# Patient Record
Sex: Male | Born: 1955 | Race: White | Hispanic: No | Marital: Single | Smoking: Never smoker
Health system: Southern US, Community
[De-identification: ages and names within clinical notes are randomized; demographics above are authoritative.]

## PROBLEM LIST (undated history)

## (undated) DIAGNOSIS — J449 Chronic obstructive pulmonary disease, unspecified: Secondary | ICD-10-CM

## (undated) DIAGNOSIS — I10 Essential (primary) hypertension: Secondary | ICD-10-CM

## (undated) DIAGNOSIS — I2510 Atherosclerotic heart disease of native coronary artery without angina pectoris: Secondary | ICD-10-CM

## (undated) HISTORY — PX: TONSILLECTOMY: SUR1361

## (undated) HISTORY — PX: EYE SURGERY: SHX253

---

## 2022-01-26 ENCOUNTER — Encounter (HOSPITAL_BASED_OUTPATIENT_CLINIC_OR_DEPARTMENT_OTHER): Payer: Self-pay | Admitting: *Deleted

## 2022-01-26 ENCOUNTER — Other Ambulatory Visit: Payer: Self-pay

## 2022-01-26 ENCOUNTER — Emergency Department (HOSPITAL_BASED_OUTPATIENT_CLINIC_OR_DEPARTMENT_OTHER)
Admission: EM | Admit: 2022-01-26 | Discharge: 2022-01-26 | Disposition: A | Discharge status: Home / Self Care | Payer: 59 | Attending: Emergency Medicine | Admitting: Emergency Medicine

## 2022-01-26 ENCOUNTER — Emergency Department (HOSPITAL_BASED_OUTPATIENT_CLINIC_OR_DEPARTMENT_OTHER): Payer: 59

## 2022-01-26 DIAGNOSIS — Z79899 Other long term (current) drug therapy: Secondary | ICD-10-CM | POA: Insufficient documentation

## 2022-01-26 DIAGNOSIS — I10 Essential (primary) hypertension: Secondary | ICD-10-CM | POA: Diagnosis not present

## 2022-01-26 DIAGNOSIS — W11XXXA Fall on and from ladder, initial encounter: Secondary | ICD-10-CM | POA: Insufficient documentation

## 2022-01-26 DIAGNOSIS — S4992XA Unspecified injury of left shoulder and upper arm, initial encounter: Secondary | ICD-10-CM | POA: Diagnosis present

## 2022-01-26 HISTORY — DX: Essential (primary) hypertension: I10

## 2022-01-26 MED ORDER — HYDROCODONE-ACETAMINOPHEN 5-325 MG PO TABS: 1.0000 | ORAL_TABLET | Freq: Four times a day (QID) | ORAL | 0 refills | Status: DC | PRN

## 2022-01-26 MED ORDER — LIDOCAINE 5 % EX PTCH
1.0000 | MEDICATED_PATCH | CUTANEOUS | Status: DC
  Administered 2022-01-26: 1 via TRANSDERMAL
  Filled 2022-01-26: qty 1

## 2022-01-26 NOTE — Discharge Instructions (Signed)
Return to the ED with any new symptoms such as numbness or tingling in the hand ?Please follow-up with Dr. Clare Gandy.  You will need to call to make an appointment to be seen.  The numbers attached to this piece paper. ?Please remain in the sling at all times unless showering before being seen by Dr. Jordan Likes ?Please only take pain medication as a last resort.  Please be aware that you should not drive or operate heavy machinery under the influence of medication.  This medication might constipate you, and I advise you getting MiraLAX or Metamucil to avoid this. ?

## 2022-01-26 NOTE — ED Notes (Signed)
Has limited movement of LUE, left hand appears swollen, left radial pulse 2+ and easily palpable, denies any tingling or numbness in LUE as well. Noted to have strong grip of left hand as well. Moves RUE without issues, has strong plantar and dorsal flexion and denies any lower back or hip pain. Neuro status WNL ?

## 2022-01-26 NOTE — ED Provider Notes (Signed)
?[facility] EMERGENCY DEPARTMENT ?Provider Note ? ? ?CSN: [id number] ?Arrival date & time: 01/26/22  [id number] ? ?  ? ?History ? ?Chief Complaint  ?Patient presents with  ? Fall  ? ? ?Chuck HintRicky E Asher is a 66 y.o. male with medical history of hypertension currently on lisinopril.  Patient presents ED for evaluation of fall.  Patient states that yesterday he fell off a ladder that was 5 feet in the air.  Patient reports that the ladder became unstable causing him to fall to the left.  Patient reports that he fell onto his left side and presents today with left shoulder pain, left elbow pain and left wrist/hand pain.  Patient denies blood thinners, patient denies any his head or losing consciousness.  Patient denies any neck pain or back pain. ? ? ?Fall ? ? ?  ? ?Home Medications ?Prior to Admission medications   ?Medication Sig Start Date End Date Taking? Authorizing Provider  ?HYDROcodone-acetaminophen (NORCO/VICODIN) 5-325 MG tablet Take 1 tablet by mouth every 6 (six) hours as needed for severe pain. 01/26/22  Yes Al DecantGroce, Jonique Kulig F, PA-C  ?lisinopril (ZESTRIL) 20 MG tablet Take 20 mg by mouth daily.   Yes [provider]  ?   ? ?Allergies    ?Patient has no known allergies.   ? ?Review of Systems   ?Review of Systems  ?Musculoskeletal:  Positive for arthralgias and myalgias. Negative for back pain and neck pain.  ?All other systems reviewed and are negative. ? ?Physical Exam ?Updated Vital Signs ?BP (!) 166/80   Pulse 78   Temp 98.1 ?F (36.7 ?C)   Resp 20   Ht 5\' 7"  (1.702 m)   Wt 79.4 kg   SpO2 98%   BMI 27.41 kg/m?  ?Physical Exam ?Vitals and nursing note reviewed.  ?Constitutional:   ?   General: He is not in acute distress. ?   Appearance: Normal appearance. He is not ill-appearing, toxic-appearing or diaphoretic.  ?HENT:  ?   Head: Normocephalic and atraumatic.  ?   Nose: Nose normal. No congestion.  ?   Mouth/Throat:  ?   Mouth: Mucous membranes are moist.  ?   Pharynx: Oropharynx is clear.   ?Eyes:  ?   Extraocular Movements: Extraocular movements intact.  ?   Conjunctiva/sclera: Conjunctivae normal.  ?   Pupils: Pupils are equal, round, and reactive to light.  ?Cardiovascular:  ?   Rate and Rhythm: Normal rate and regular rhythm.  ?Pulmonary:  ?   Effort: Pulmonary effort is normal.  ?   Breath sounds: Normal breath sounds. No wheezing.  ?Abdominal:  ?   General: Abdomen is flat. Bowel sounds are normal.  ?   Palpations: Abdomen is soft.  ?   Tenderness: There is no abdominal tenderness.  ?Musculoskeletal:  ?   Right shoulder: Normal.  ?   Left shoulder: Swelling and tenderness present. No deformity, effusion or laceration. Decreased range of motion. Decreased strength. Normal pulse.  ?   Cervical back: Normal range of motion and neck supple. No tenderness.  ?   Comments: Patient has decreased strength to left upper extremity.  Patient also has decreased ability to range his left shoulder.  The patient is unable to actively range his shoulder and can only tolerate slight ranging of his left shoulder passively.  2+ radial pulse noted.  ?Skin: ?   General: Skin is warm and dry.  ?   Capillary Refill: Capillary refill takes less than 2 seconds.  ?Neurological:  ?  General: No focal deficit present.  ?   Mental Status: He is alert and oriented to person, place, and time.  ?   GCS: GCS eye subscore is 4. GCS verbal subscore is 5. GCS motor subscore is 6.  ?   Cranial Nerves: Cranial nerves 2-12 are intact.  ?   Sensory: Sensation is intact. No sensory deficit.  ?   Motor: Motor function is intact. No weakness.  ?   Coordination: Coordination is intact. Heel to [facility] Test normal.  ? ? ?ED Results / Procedures / Treatments   ?Labs ?(all labs ordered are listed, but only abnormal results are displayed) ?Labs Reviewed - No data to display ? ?EKG ?None ? ?Radiology ?DG Forearm Left ? ?Result Date: 01/26/2022 ?CLINICAL DATA:  Pain after fall from ladder yesterday. EXAM: LEFT FOREARM - 2 VIEW COMPARISON:  None  Available. FINDINGS: There is no evidence of acute fracture or other focal bone lesions. Well corticated osseous fragment at the ulnar styloid process reflect sequela prior ulnar styloid process fracture. Soft tissues are unremarkable. IMPRESSION: No acute osseous abnormality identified. Electronically Signed   By: Maudry Mayhew M.D.   On: 01/26/2022 10:20  ? ?DG Humerus Left ? ?Result Date: 01/26/2022 ?CLINICAL DATA:  Fall on shoulder from 5 foot ladder yesterday. EXAM: LEFT HUMERUS - 2+ VIEW COMPARISON:  None Available. FINDINGS: There is no evidence of fracture or other focal bone lesions. Soft tissues are unremarkable. Visualized lung fields are clear. No displaced rib fracture identified within the acquired field-of-view. IMPRESSION: No acute osseous abnormality identified. Electronically Signed   By: Maudry Mayhew M.D.   On: 01/26/2022 10:19  ? ?DG Hand Complete Left ? ?Result Date: 01/26/2022 ?CLINICAL DATA:  Pain after fall from ladder yesterday. EXAM: LEFT HAND - COMPLETE 3+ VIEW COMPARISON:  None Available. FINDINGS: There is no evidence of acute fracture or dislocation. Well corticated osseous fragment at the ulnar styloid process reflect sequela prior trauma. Mild multifocal degenerative change of the hand. Soft tissues are unremarkable. IMPRESSION: 1. No acute fracture or dislocation of the left hand identified. 2. Mild multifocal degenerative change. Electronically Signed   By: Maudry Mayhew M.D.   On: 01/26/2022 10:22   ? ?Procedures ?Procedures  ? ?Medications Ordered in ED ?Medications  ?lidocaine (LIDODERM) 5 % 1 patch (1 patch Transdermal Patch Applied 01/26/22 1127)  ? ? ?ED Course/ Medical Decision Making/ A&P ?  ?                        ?Medical Decision Making ?Amount and/or Complexity of Data Reviewed ?Radiology: ordered. ? ? ?66 year old male presents to ED for evaluation.  Please see HPI for further details. ? ?On examination, the patient is alert and orient x4.  Patient does not have any  focal neurodeficits on neurological examination.  Patient is afebrile, nontachycardic.  Patient lung sounds are clear bilaterally, the patient is nonhypoxic on room air.  Patient abdomen soft and compressible all 4 quadrants.  Patient complains of left-sided wrist pain however the patient has full range of motion of his wrist with slight soft tissue swelling.  Patient planes of elbow pain however patient has full range of motion to the left elbow with no soft tissue swelling.  Patient also complaining of left shoulder pain, the patient is unable to range his left shoulder actively.  With passive range of motion exercises, the patient complains of increased pain. ? ?Patient worked up utilizing following labs and imaging  studies interpreted by me personally: ?- Plain film imaging of left hand, left forearm, left humerus do not show any sign of fracture, dislocation, subluxation. ? ?At this time, patient symptoms may be a result of rotator cuff tear.  Patient will be referred to sports medicine doctor, placed in sling, provided with lidocaine patch as well as pain management pills.  Patient given return precautions and he voiced understanding with these.  Patient had all his questions answered to satisfaction.  The patient is stable at this time for discharge home. ? ? ?Final Clinical Impression(s) / ED Diagnoses ?Final diagnoses:  ?Injury of left shoulder, initial encounter  ? ? ?Rx / DC Orders ?ED Discharge Orders   ? ?      Ordered  ?  HYDROcodone-acetaminophen (NORCO/VICODIN) 5-325 MG tablet  Every 6 hours PRN       ? 01/26/22 1143  ? ?  ?  ? ?  ? ? ?  ?Al Decant, PA-C ?01/26/22 1204 ? ?  ?Derwood Kaplan, MD ?01/26/22 1230 ? ?

## 2022-01-26 NOTE — ED Triage Notes (Signed)
States he fell off a 109ft ladder yesterday onto the ground, no LOC, presents with left shoulder/ scapula pain, left elbow pain and left wrist/ hand pain ?

## 2022-01-27 ENCOUNTER — Ambulatory Visit: Payer: 59 | Admitting: Family Medicine

## 2022-03-11 ENCOUNTER — Ambulatory Visit: Payer: 59 | Admitting: Orthopaedic Surgery

## 2022-03-19 ENCOUNTER — Ambulatory Visit: Payer: 59 | Admitting: Orthopaedic Surgery

## 2022-03-19 ENCOUNTER — Encounter: Payer: Self-pay | Admitting: Orthopaedic Surgery

## 2022-03-19 ENCOUNTER — Telehealth: Payer: Self-pay | Admitting: Orthopaedic Surgery

## 2022-03-19 ENCOUNTER — Ambulatory Visit (INDEPENDENT_AMBULATORY_CARE_PROVIDER_SITE_OTHER): Payer: 59

## 2022-03-19 DIAGNOSIS — M25512 Pain in left shoulder: Secondary | ICD-10-CM | POA: Insufficient documentation

## 2022-03-19 NOTE — Progress Notes (Signed)
Office Visit Note   Patient: Trevor Daniels           Date of Birth: 1955/10/16           MRN: [id number] Visit Date: 03/19/2022              Requested by: Darryll Capers, PA-C [address] [place],  [place] [address] PCP: Darryll Capers, PA-C   Assessment & Plan: Visit Diagnoses:  1. 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  Will need MRI to fully evaluate the extent of the injury.  May need to consider physical therapy to address the frozen shoulder prior to surgical intervention if that is indicated.  Follow-up after the MRI.  Work note provided today.  Follow-Up Instructions: No follow-ups on file.   Orders:  Orders Placed This Encounter  Procedures   XR Shoulder Left   No orders of the defined types were placed in this encounter.     Procedures: No procedures performed   Clinical Data: No additional findings.   Subjective: Chief Complaint  Patient presents with   Left Shoulder - Pain    HPI Rashon is a 66 year old gentleman follow-up from the ED status post mechanical fall from the ladder on 01/25/2022.  He fell on his elbow and jammed his shoulder.  He has had inability to move his arm above his head since then.  Currently still works.  Review of Systems  Constitutional: Negative.   All other systems reviewed and are negative.    Objective: Vital Signs: There were no vitals taken for this visit.  Physical Exam Vitals and nursing note reviewed.  Constitutional:      Appearance: He is well-developed.  HENT:     Head: Normocephalic and atraumatic.  Eyes:     Pupils: Pupils are equal, round, and reactive to light.  Pulmonary:     Effort: Pulmonary effort is normal.  Abdominal:     Palpations: Abdomen is soft.  Musculoskeletal:        General: Normal range of motion.     Cervical back: Neck supple.  Skin:    General: Skin is warm.  Neurological:     Mental  Status: He is alert and oriented to person, place, and time.  Psychiatric:        Behavior: Behavior normal.        Thought Content: Thought content normal.        Judgment: Judgment normal.     Ortho Exam Examination left shoulder shows moderate decrease and passive flexion external rotation abduction.  Active motion of the rotator cuff consistent with pseudoparalysis.  No real bony tenderness.  No neck symptoms. Specialty Comments:  No specialty comments available.  Imaging: XR Shoulder Left  Result Date: 03/19/2022 Type II acromion.  No acute abnormalities.    PMFS History: Patient Active Problem List   Diagnosis Date Noted   Acute pain of left shoulder 03/19/2022   Past Medical History:  Diagnosis Date   Hypertension     History reviewed. No pertinent family history.  History reviewed. No pertinent surgical history. Social History   Occupational History   Not on file  Tobacco Use   Smoking status: Never   Smokeless tobacco: Not on file  Substance and Sexual Activity   Alcohol use: Never   Drug use: Never   Sexual activity: Not on file

## 2022-03-19 NOTE — Telephone Encounter (Signed)
Patient called in stating he is out of network for [facility] for his MRI but he called [facility] in [place] said they would accept his insurance but we need to authorize it first.

## 2022-03-19 NOTE — Addendum Note (Signed)
Addended by: Wendi Maya on: 03/19/2022 11:00 AM   Modules accepted: Orders

## 2022-03-20 NOTE — Telephone Encounter (Signed)
Referral faxed to [employer] for authorization-once approved will send to [facility]

## 2022-03-24 ENCOUNTER — Ambulatory Visit (INDEPENDENT_AMBULATORY_CARE_PROVIDER_SITE_OTHER): Payer: 59

## 2022-03-24 DIAGNOSIS — M25512 Pain in left shoulder: Secondary | ICD-10-CM

## 2022-03-28 ENCOUNTER — Other Ambulatory Visit: Payer: 59

## 2022-03-31 ENCOUNTER — Ambulatory Visit: Payer: 59 | Admitting: Orthopaedic Surgery

## 2022-03-31 ENCOUNTER — Encounter: Payer: Self-pay | Admitting: Orthopaedic Surgery

## 2022-03-31 DIAGNOSIS — M25512 Pain in left shoulder: Secondary | ICD-10-CM

## 2022-03-31 NOTE — Progress Notes (Signed)
   Office Visit Note   Patient: Trevor Daniels           Date of Birth: 1956-04-07           MRN: [id number] Visit Date: 03/31/2022              Requested by: Darryll Capers, PA-C [address] [place],  [place] [address] PCP: Darryll Capers, PA-C   Assessment & Plan: Visit Diagnoses:  1. Acute pain of left shoulder     Plan: Impression is large rotator cuff tears with mild atrophy.  Not sure if this is repairable or needs reverse shoulder.  Will refer to Dr. Steward Drone for surgical treatment.    Follow-Up Instructions: No follow-ups on file.   Orders:  No orders of the defined types were placed in this encounter.  No orders of the defined types were placed in this encounter.     Procedures: No procedures performed   Clinical Data: No additional findings.   Subjective: Chief Complaint  Patient presents with   Left Shoulder - Follow-up    MRI review    HPI Trevor Daniels is here to review left shoulder MRI. Review of Systems   Objective: Vital Signs: There were no vitals taken for this visit.  Physical Exam  Ortho Exam Examination of left shoulder shows significant pain with passive range of motion.  Pain and significant weakness with testing of subscap infraspinatus and supraspinatus with drop arm sign. Specialty Comments:  No specialty comments available.  Imaging: No results found.   PMFS History: Patient Active Problem List   Diagnosis Date Noted   Acute pain of left shoulder 03/19/2022   Past Medical History:  Diagnosis Date   Hypertension     No family history on file.  History reviewed. No pertinent surgical history. Social History   Occupational History   Not on file  Tobacco Use   Smoking status: Never   Smokeless tobacco: Not on file  Substance and Sexual Activity   Alcohol use: Never   Drug use: Never   Sexual activity: Not on file

## 2022-04-15 ENCOUNTER — Ambulatory Visit (INDEPENDENT_AMBULATORY_CARE_PROVIDER_SITE_OTHER): Payer: BC Managed Care – PPO | Admitting: Orthopaedic Surgery

## 2022-04-15 ENCOUNTER — Other Ambulatory Visit (HOSPITAL_BASED_OUTPATIENT_CLINIC_OR_DEPARTMENT_OTHER): Payer: Self-pay

## 2022-04-15 ENCOUNTER — Ambulatory Visit (HOSPITAL_BASED_OUTPATIENT_CLINIC_OR_DEPARTMENT_OTHER): Payer: Self-pay | Admitting: Orthopaedic Surgery

## 2022-04-15 DIAGNOSIS — M12812 Other specific arthropathies, not elsewhere classified, left shoulder: Secondary | ICD-10-CM | POA: Diagnosis not present

## 2022-04-15 MED ORDER — OXYCODONE HCL 5 MG PO TABS
5.0000 mg | ORAL_TABLET | ORAL | 0 refills | Status: AC | PRN
  Filled 2022-04-15: qty 20, 4d supply, fill #0

## 2022-04-15 MED ORDER — ASPIRIN 325 MG PO TBEC
325.0000 mg | DELAYED_RELEASE_TABLET | Freq: Every day | ORAL | 0 refills | Status: AC
  Filled 2022-04-15: qty 30, 30d supply, fill #0

## 2022-04-15 MED ORDER — ACETAMINOPHEN 500 MG PO TABS
500.0000 mg | ORAL_TABLET | Freq: Three times a day (TID) | ORAL | 0 refills | Status: AC
  Filled 2022-04-15: qty 30, 10d supply, fill #0

## 2022-04-15 MED ORDER — IBUPROFEN 800 MG PO TABS
800.0000 mg | ORAL_TABLET | Freq: Three times a day (TID) | ORAL | 0 refills | Status: AC
  Filled 2022-04-15: qty 30, 10d supply, fill #0

## 2022-04-15 NOTE — Progress Notes (Signed)
Chief Complaint: Left shoulder pseudoparalysis     History of Present Illness:    Trevor Daniels is a 66 y.o. male right-hand-dominant male presents with left shoulder pain and limited motion since Jan 26, 2022.  He states that at that time he had a fall directly on the shoulder when he landed off of a ladder onto the elbow.  Since that time he has had essentially no range of motion or function of the left shoulder.  He is here today as a referral from Dr.Xu as he was found to have a large rotator cuff tear.  He states that he does have a history of right shoulder rotator cuff tear which is treated with conservative management is overall doing quite well.  He is here today as he states the left shoulder feels different.  He is essentially not able to use the left arm whatsoever.  He is taking Tylenol.  He is having a very difficult time laying on the sleep.  He still does work part-time in [employer] and as a result has to do heavy lifting which she has not been able to do as a result of the shoulder.  He does not smoke.  He is on 81 mg aspirin.    Surgical History:   None  PMH/PSH/Family History/Social History/Meds/Allergies:    Past Medical History:  Diagnosis Date   Hypertension    No past surgical history on file. Social History   Socioeconomic History   Marital status: Single    Spouse name: Not on file   Number of children: Not on file   Years of education: Not on file   Highest education level: Not on file  Occupational History   Not on file  Tobacco Use   Smoking status: Never   Smokeless tobacco: Not on file  Substance and Sexual Activity   Alcohol use: Never   Drug use: Never   Sexual activity: Not on file  Other Topics Concern   Not on file  Social History Narrative   Not on file   Social Determinants of Health   Financial Resource Strain: Not on file  Food Insecurity: Not on file  Transportation Needs: Not on file   Physical Activity: Not on file  Stress: Not on file  Social Connections: Not on file   No family history on file. No Known Allergies Current Outpatient Medications  Medication Sig Dispense Refill   acetaminophen (TYLENOL) 500 MG tablet Take 1 tablet (500 mg total) by mouth every 8 (eight) hours for 10 days. 30 tablet 0   aspirin EC 325 MG tablet Take 1 tablet (325 mg total) by mouth daily. 30 tablet 0   ibuprofen (ADVIL) 800 MG tablet Take 1 tablet (800 mg total) by mouth every 8 (eight) hours for 10 days. Please take with food, please alternate with acetaminophen 30 tablet 0   oxyCODONE (OXY IR/ROXICODONE) 5 MG immediate release tablet Take 1 tablet (5 mg total) by mouth every 4 (four) hours as needed (severe pain). 20 tablet 0   lisinopril (ZESTRIL) 20 MG tablet Take 20 mg by mouth daily.     No current facility-administered medications for this visit.   No results found.  Review of Systems:   A ROS was performed including pertinent positives and negatives as documented in the  HPI.  Physical Exam :   Constitutional: NAD and appears stated age Neurological: Alert and oriented Psych: Appropriate affect and cooperative There were no vitals taken for this visit.   Comprehensive Musculoskeletal Exam:    Musculoskeletal Exam    Inspection Right Left  Skin No atrophy or winging No atrophy or winging  Palpation    Tenderness None Lateral deltoid  Range of Motion    Flexion (passive) 170 100  Flexion (active) 170 10  Abduction 170 10  ER at the side 70 0  Can reach behind back to T12 Back pocket  Strength     Full 3/5, negative belly press  Special Tests    Pseudoparalytic No No  Neurologic    Fires PIN, radial, median, ulnar, musculocutaneous, axillary, suprascapular, long thoracic, and spinal accessory innervated muscles. No abnormal sensibility  Vascular/Lymphatic    Radial Pulse 2+ 2+  Cervical Exam    Patient has symmetric cervical range of motion with negative  Spurling's test.  Special Test:      Imaging:   Xray (3 views left shoulder): Mild AC joint arthritis  MRI (left shoulder): Full-thickness massive rotator cuff tear involving the supra and infraspinatus with retraction beyond the level of the glenoid and significant atrophy on T1 imaging involving both the supraspinatus and infraspinatus  I personally reviewed and interpreted the radiographs.   Assessment:   66 y.o. male right-hand-dominant male presents with left shoulder pseudoparalysis following a massive rotator cuff injury on May 15 where he fell off of a ladder.  At today's visit I did discuss treatment options with him.  Given the fact that he does have completely nonfunctional left shoulder I do believe that surgical treatment is necessary in order to optimize his ability to use the arm.  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  He is not able to lift that arm at all away from the side.  I discussed with him treatment options including rotator cuff repair versus superior capsular reconstruction versus reverse shoulder arthroplasty.  Given the fact that his tear is very large and retracted beyond the glenoid with significant fatty atrophy I do believe that he would be less likely to benefit from a rotator cuff repair.  This impacts given the fact that he does have essentially pseudoparalysis I do believe that a superior capsule reconstruction would be less helpful for him.  At this time I believe that a reverse shoulder arthroplasty would give him the quickest and best likelihood of recovery.  I did discuss with him the specific risks associated with this including hardware failure or infection.  He would like to proceed with this.  Plan :    -Plan for left shoulder reverse shoulder arthroplasty   After a lengthy discussion of treatment options, including risks, benefits, alternatives, complications of  surgical and nonsurgical conservative options, the patient elected surgical repair.   The patient  is aware of the material risks  and complications including, but not limited to injury to adjacent structures, neurovascular injury, infection, numbness, bleeding, implant failure, thermal burns, stiffness, persistent pain, failure to heal, disease transmission from allograft, need for further surgery, dislocation, anesthetic risks, blood clots, risks of death,and others. The probabilities of surgical success and failure discussed with patient given their particular co-morbidities.The time and nature of expected rehabilitation and recovery was discussed.The patient's questions were all answered preoperatively.  No barriers to understanding were noted.  I explained the natural history of the disease process and Rx rationale.  I explained to the patient what I considered to be reasonable expectations given their personal situation.  The final treatment plan was arrived at through a shared patient decision making process model.      I personally saw and evaluated the patient, and participated in the management and treatment plan.  Huel Cote, MD Attending Physician, Orthopedic Surgery  This document was dictated using Dragon voice recognition software. A reasonable attempt at proof reading has been made to minimize errors.

## 2022-04-19 ENCOUNTER — Ambulatory Visit (HOSPITAL_BASED_OUTPATIENT_CLINIC_OR_DEPARTMENT_OTHER)
Admission: RE | Admit: 2022-04-19 | Discharge: 2022-04-19 | Disposition: A | Discharge status: Home / Self Care | Payer: BC Managed Care – PPO | Source: Ambulatory Visit | Attending: Orthopaedic Surgery | Admitting: Orthopaedic Surgery

## 2022-04-19 DIAGNOSIS — M12812 Other specific arthropathies, not elsewhere classified, left shoulder: Secondary | ICD-10-CM | POA: Diagnosis present

## 2022-04-22 ENCOUNTER — Ambulatory Visit (HOSPITAL_BASED_OUTPATIENT_CLINIC_OR_DEPARTMENT_OTHER): Payer: BC Managed Care – PPO

## 2022-05-06 ENCOUNTER — Ambulatory Visit (HOSPITAL_BASED_OUTPATIENT_CLINIC_OR_DEPARTMENT_OTHER): Payer: BC Managed Care – PPO | Admitting: Orthopaedic Surgery

## 2022-05-06 ENCOUNTER — Encounter (HOSPITAL_BASED_OUTPATIENT_CLINIC_OR_DEPARTMENT_OTHER): Payer: Self-pay | Admitting: Orthopaedic Surgery

## 2022-05-06 ENCOUNTER — Encounter (HOSPITAL_BASED_OUTPATIENT_CLINIC_OR_DEPARTMENT_OTHER): Payer: Self-pay

## 2022-06-30 NOTE — Progress Notes (Addendum)
Surgical Instructions    Your procedure is scheduled on Tuesday October 24th.  Report to [facility] Main Entrance "A" at 1130 A.M., then check in with the Admitting office.  Call this number if you have problems the morning of surgery:  [phone]   If you have any questions prior to your surgery date call [phone]: Open Monday-Friday 8am-4pm If you experience any cold or flu symptoms such as cough, fever, chills, shortness of breath, etc. between now and your scheduled surgery, please notify us at the above number     Remember:  Do not eat after midnight the night before your surgery  You may drink clear liquids until 10:30 the morning of your surgery.   Clear liquids allowed are: Water, Non-Citrus Juices (without pulp), Carbonated Beverages, Clear Tea, Black Coffee ONLY (NO MILK, CREAM OR POWDERED CREAMER of any kind), and Gatorade   Enhanced Recovery after Surgery for Orthopedics Enhanced Recovery after Surgery is a protocol used to improve the stress on your body and your recovery after surgery.  Patient Instructions  The day of surgery (if you do NOT have diabetes):  Drink ONE (1) Pre-Surgery Clear Ensure by __1030__ am the morning of surgery   This drink was given to you during your hospital  pre-op appointment visit. Nothing else to drink after completing the  Pre-Surgery Clear Ensure.          If you have questions, please contact your surgeon's office.   Take these medicines the morning of surgery with A SIP OF WATER: BREZTRI AEROSPHERE 160-9-4.8 MCG/ACT AERO  IF NEEDED albuterol (VENTOLIN HFA) 108 (90 Base) MCG/ACT inhaler oxyCODONE (OXY IR/ROXICODONE) 5 MG immediate release tablet  Please bring all inhalers with you to the hospital.   Follow your surgeon's instructions on when to stop Aspirin.  If no instructions were given by your surgeon then you will need to call the office to get those instructions.     As of today, STOP taking any Aspirin (unless  otherwise instructed by your surgeon) Aleve, Naproxen, Ibuprofen, Motrin, Advil, Goody's, BC's, all herbal medications, fish oil, and all vitamins.           Do not wear jewelry  Do not wear lotions, powders, cologne or deodorant. Do not shave 48 hours prior to surgery.  Men may shave face and neck. Do not bring valuables to the hospital. Do not wear nail polish  [facility] is not responsible for any belongings or valuables.    Do NOT Smoke (Tobacco/Vaping)  24 hours prior to your procedure  If you use a CPAP at night, you may bring your mask for your overnight stay.   Contacts, glasses, hearing aids, dentures or partials may not be worn into surgery, please bring cases for these belongings   For patients admitted to the hospital, discharge time will be determined by your treatment team.   Patients discharged the day of surgery will not be allowed to drive home, and someone needs to stay with them for 24 hours.   SURGICAL WAITING ROOM VISITATION Patients having surgery or a procedure may have no more than 2 support people in the waiting area - these visitors may rotate.   Children under the age of 34 must have an adult with them who is not the patient. If the patient needs to stay at the hospital during part of their recovery, the visitor guidelines for inpatient rooms apply. Pre-op nurse will coordinate an appropriate time for 1 support person to accompany  patient in pre-op.  This support person may not rotate.   Please refer to [email or web address] for the visitor guidelines for Inpatients (after your surgery is over and you are in a regular room).    Special instructions:     [place]- Preparing for Shoulder Surgery  ?  Before surgery, you can play an important role. Because skin is not sterile, your skin needs to be as free of germs as possible. You can reduce the number of germs on your skin by using the following  products.   1). Benzoyl Peroxide Gel: reduces the number of germs present on the skin   *Applied twice a day to shoulder area starting two days before surgery     2). Chlorhexidine Gluconate (CHG) Soap: An antiseptic cleaner that kills germs and bonds with the skin to continue killing germs even after washing   *Used for showering the night before surgery and morning of surgery   ?    Please follow these instructions carefully:     1). BENZOYL PEROXIDE 5% GEL (Please do not use if you have an allergy to benzoyl peroxide.   If your skin becomes reddened/irritated stop using the benzoyl peroxide)     Starting TWO DAYS BEFORE surgery:    Apply benzoyl peroxide in the morning and at night. Apply after taking a shower. If you are not taking a shower clean entire shoulder front, back, and side along with the armpit with a clean wet washcloth.     Place a quarter-sized amount of gel on your shoulder and rub in thoroughly, making sure to cover the front, back, and side of your shoulder, along with the armpit.                           Do this twice a day for two days.  (Last application is the night before surgery, AFTER using the CHG soap as described below).   Do NOT apply benzoyl peroxide gel on the day of surgery.   2 days before ____ AM   ____ PM              1 day before ____ AM   ____ PM    2) CHG Soap: Please do not use if you have an allergy to CHG or antibacterial soaps. If your skin becomes reddened/irritated stop using the CHG.  Do not shave (including legs and underarms) for at least 48 hours prior to first CHG shower. It is OK to shave your face.  Please follow these instructions carefully.   Shower the NIGHT BEFORE SURGERY (before applying benzoyl peroxide gel) and the MORNING OF SURGERY with CHG Soap.   If you chose to wash your hair, wash your hair first as usual with your normal shampoo.  After you shampoo, rinse your hair and body thoroughly to remove the  shampoo.  Use CHG as you would any other liquid soap. You can apply CHG directly to the skin and wash gently with a scrungie or a clean washcloth.   Apply the CHG Soap to your body ONLY FROM THE NECK DOWN.  Do not use on open wounds or open sores. Avoid contact with your eyes, ears, mouth and genitals (private parts). Wash Face and genitals (private parts) with your normal soap.   Wash thoroughly, paying special attention to the area where your surgery will be performed.  Thoroughly rinse your body with warm water from the neck down.  DO  NOT shower/wash with your normal soap after using and rinsing off the CHG Soap.  Pat yourself dry with a CLEAN TOWEL.  Wear CLEAN PAJAMAS to bed the night before surgery  Place CLEAN SHEETS on your bed the night of your first shower and DO NOT SLEEP WITH PETS.  Oral Hygiene is also important to reduce your risk of infection.  Remember - BRUSH YOUR TEETH THE MORNING OF SURGERY WITH YOUR REGULAR TOOTHPASTE    Day of Surgery:  Take a shower with CHG soap. Wear Clean/Comfortable clothing the morning of surgery Do not apply any deodorants/lotions.   Remember to brush your teeth WITH YOUR REGULAR TOOTHPASTE.    If you received a COVID test during your pre-op visit, it is requested that you wear a mask when out in public, stay away from anyone that may not be feeling well, and notify your surgeon if you develop symptoms. If you have been in contact with anyone that has tested positive in the last 10 days, please notify your surgeon.    Please read over the following fact sheets that you were given.

## 2022-07-01 ENCOUNTER — Encounter (HOSPITAL_COMMUNITY): Payer: Self-pay

## 2022-07-01 ENCOUNTER — Encounter (HOSPITAL_COMMUNITY)
Admission: RE | Admit: 2022-07-01 | Discharge: 2022-07-01 | Disposition: A | Discharge status: Home / Self Care | Payer: Medicare HMO | Source: Ambulatory Visit | Attending: Orthopaedic Surgery | Admitting: Orthopaedic Surgery

## 2022-07-01 ENCOUNTER — Other Ambulatory Visit: Payer: Self-pay

## 2022-07-01 VITALS — BP 173/80 | HR 78 | Temp 97.5°F | Resp 17 | Ht 68.0 in | Wt 191.3 lb

## 2022-07-01 DIAGNOSIS — M25512 Pain in left shoulder: Secondary | ICD-10-CM

## 2022-07-01 DIAGNOSIS — I10 Essential (primary) hypertension: Secondary | ICD-10-CM | POA: Insufficient documentation

## 2022-07-01 DIAGNOSIS — Z01818 Encounter for other preprocedural examination: Secondary | ICD-10-CM | POA: Insufficient documentation

## 2022-07-01 HISTORY — DX: Chronic obstructive pulmonary disease, unspecified: J44.9

## 2022-07-01 HISTORY — DX: Atherosclerotic heart disease of native coronary artery without angina pectoris: I25.10

## 2022-07-01 LAB — BASIC METABOLIC PANEL
Anion gap: 5 (ref 5–15)
BUN: 22 mg/dL (ref 8–23)
CO2: 28 mmol/L (ref 22–32)
Calcium: 9.2 mg/dL (ref 8.9–10.3)
Chloride: 105 mmol/L (ref 98–111)
Creatinine, Ser: 0.9 mg/dL (ref 0.61–1.24)
GFR, Estimated: >60 mL/min (ref >60)
Glucose, Bld: 113 mg/dL — ABNORMAL HIGH (ref 70–99)
Potassium: 4.6 mmol/L (ref 3.5–5.1)
Sodium: 138 mmol/L (ref 135–145)

## 2022-07-01 LAB — SURGICAL PCR SCREEN
MRSA, PCR: NEGATIVE
Staphylococcus aureus: POSITIVE — AB

## 2022-07-01 LAB — TYPE AND SCREEN
ABO/RH(D): A POS
Antibody Screen: NEGATIVE

## 2022-07-01 LAB — CBC
HCT: 38.8 % — ABNORMAL LOW (ref 39.0–52.0)
Hemoglobin: 13.1 g/dL (ref 13.0–17.0)
MCH: 33.2 pg (ref 26.0–34.0)
MCHC: 33.8 g/dL (ref 30.0–36.0)
MCV: 98.5 fL (ref 80.0–100.0)
Platelets: 239 10*3/uL (ref 150–400)
RBC: 3.94 MIL/uL — ABNORMAL LOW (ref 4.22–5.81)
RDW: 13.6 % (ref 11.5–15.5)
WBC: 5.7 10*3/uL (ref 4.0–10.5)
nRBC: 0 % (ref 0.0–0.2)

## 2022-07-01 NOTE — Progress Notes (Addendum)
PCP - Candida Peeling, PA-C Cardiologist - Dr.John Cahill-Bethany   PPM/ICD - denies Device Orders - n/a Rep Notified - n/a  Chest x-ray - n/a EKG - 07/01/22 Stress Test - denies ECHO - denies Cardiac Cath - denies  Sleep Study - denies CPAP - n/a  DM-denies Blood Thinner Instructions:denies Aspirin Instructions:-pt states the form he received state to stop Aspirin 2 days before his surgery   ERAS Protcol - PRE-SURGERY Ensure or G2-   COVID TEST- n/a   Anesthesia review: YES. Cardiac HX. CAD. Pt stated he has cardiac clearance from [facility]. Records requested from [facility].   Patient denies shortness of breath, fever, cough and chest pain at PAT appointment   All instructions explained to the patient, with a verbal understanding of the material. Patient agrees to go over the instructions while at home for a better understanding. The opportunity to ask questions was provided.

## 2022-07-02 ENCOUNTER — Encounter (HOSPITAL_COMMUNITY): Payer: Self-pay

## 2022-07-02 NOTE — Progress Notes (Signed)
Anesthesia Chart Review:  Patient's chronic conditions followed by PCP Candida Peeling, PA-C at [facility].  She recently referred patient to cardiologist at [facility] Dr. Tonia Ghent for evaluation of coronary calcifications on CT and preop evaluation.  Dr. Edson Snowball felt no additional evaluation was needed and cleared the patient for surgery.  Clearance dated 06/21/2022 states, "no preoperative medication adjustments requested.  May hold aspirin for up to 4 weeks."  Copy on chart.  History of COPD, maintained on Breztri and as needed albuterol.  Preop labs reviewed, unremarkable.  EKG 07/01/2022: NSR.  Rate 68.   Wynonia Musty [facility]/Anesthesiology Phone [phone] 07/02/2022 11:14 AM

## 2022-07-02 NOTE — Anesthesia Preprocedure Evaluation (Addendum)
Anesthesia Evaluation  Patient identified by MRN, date of birth, ID band Patient awake    Reviewed: Allergy & Precautions, NPO status , Patient's Chart, lab work & pertinent test results  Airway Mallampati: II  TM Distance: >3 FB Neck ROM: Full    Dental  (+) Edentulous Upper   Pulmonary COPD,    Pulmonary exam normal breath sounds clear to auscultation       Cardiovascular hypertension, Pt. on medications + CAD  Normal cardiovascular exam Rhythm:Regular Rate:Normal     Neuro/Psych negative neurological ROS  negative psych ROS   GI/Hepatic negative GI ROS, Neg liver ROS,   Endo/Other  negative endocrine ROS  Renal/GU negative Renal ROS  negative genitourinary   Musculoskeletal negative musculoskeletal ROS (+)   Abdominal   Peds negative pediatric ROS (+)  Hematology negative hematology ROS (+)   Anesthesia Other Findings   Reproductive/Obstetrics negative OB ROS                           Anesthesia Physical Anesthesia Plan  ASA: 3  Anesthesia Plan: General and Regional   Post-op Pain Management: Regional block*, Tylenol PO (pre-op)* and Gabapentin PO (pre-op)*   Induction: Intravenous  PONV Risk Score and Plan: 2 and Ondansetron, Midazolam and Treatment may vary due to age or medical condition  Airway Management Planned: Oral ETT  Additional Equipment:   Intra-op Plan:   Post-operative Plan: Extubation in OR  Informed Consent: I have reviewed the patients History and Physical, chart, labs and discussed the procedure including the risks, benefits and alternatives for the proposed anesthesia with the patient or authorized representative who has indicated his/her understanding and acceptance.     Dental advisory given  Plan Discussed with: CRNA  Anesthesia Plan Comments: (PAT note by Karoline Caldwell, PA-C: Patient's chronic conditions followed by PCP Candida Peeling, PA-C at  [facility].  She recently referred patient to cardiologist at [facility] Dr. Tonia Ghent for evaluation of coronary calcifications on CT and preop evaluation.  Dr. Edson Snowball felt no additional evaluation was needed and cleared the patient for surgery.  Clearance dated 06/21/2022 states, "no preoperative medication adjustments requested.  May hold aspirin for up to 4 weeks."  Copy on chart.  History of COPD, maintained on Breztri and as needed albuterol.  Preop labs reviewed, unremarkable.  EKG 07/01/2022: NSR.  Rate 68. )       Anesthesia Quick Evaluation

## 2022-07-07 ENCOUNTER — Encounter (HOSPITAL_COMMUNITY)
Admission: RE | Disposition: A | Discharge status: Home / Self Care | Payer: Self-pay | Source: Home / Self Care | Attending: Orthopaedic Surgery

## 2022-07-07 ENCOUNTER — Ambulatory Visit (HOSPITAL_COMMUNITY): Payer: Medicare HMO | Admitting: Physician Assistant

## 2022-07-07 ENCOUNTER — Encounter (HOSPITAL_COMMUNITY): Payer: Self-pay | Admitting: Orthopaedic Surgery

## 2022-07-07 ENCOUNTER — Ambulatory Visit (HOSPITAL_COMMUNITY): Payer: Medicare HMO

## 2022-07-07 ENCOUNTER — Ambulatory Visit (HOSPITAL_BASED_OUTPATIENT_CLINIC_OR_DEPARTMENT_OTHER): Payer: Medicare HMO | Admitting: Certified Registered Nurse Anesthetist

## 2022-07-07 ENCOUNTER — Ambulatory Visit (HOSPITAL_COMMUNITY)
Admission: RE | Admit: 2022-07-07 | Discharge: 2022-07-07 | Disposition: A | Discharge status: Home / Self Care | Payer: Medicare HMO | Attending: Orthopaedic Surgery | Admitting: Orthopaedic Surgery

## 2022-07-07 DIAGNOSIS — M12812 Other specific arthropathies, not elsewhere classified, left shoulder: Secondary | ICD-10-CM

## 2022-07-07 DIAGNOSIS — Z01818 Encounter for other preprocedural examination: Secondary | ICD-10-CM

## 2022-07-07 DIAGNOSIS — Z7982 Long term (current) use of aspirin: Secondary | ICD-10-CM | POA: Insufficient documentation

## 2022-07-07 DIAGNOSIS — M75120 Complete rotator cuff tear or rupture of unspecified shoulder, not specified as traumatic: Secondary | ICD-10-CM | POA: Diagnosis not present

## 2022-07-07 DIAGNOSIS — Y939 Activity, unspecified: Secondary | ICD-10-CM | POA: Insufficient documentation

## 2022-07-07 DIAGNOSIS — W11XXXA Fall on and from ladder, initial encounter: Secondary | ICD-10-CM | POA: Insufficient documentation

## 2022-07-07 DIAGNOSIS — I2510 Atherosclerotic heart disease of native coronary artery without angina pectoris: Secondary | ICD-10-CM

## 2022-07-07 DIAGNOSIS — I10 Essential (primary) hypertension: Secondary | ICD-10-CM | POA: Diagnosis not present

## 2022-07-07 DIAGNOSIS — M75102 Unspecified rotator cuff tear or rupture of left shoulder, not specified as traumatic: Secondary | ICD-10-CM | POA: Diagnosis not present

## 2022-07-07 DIAGNOSIS — S46012A Strain of muscle(s) and tendon(s) of the rotator cuff of left shoulder, initial encounter: Secondary | ICD-10-CM | POA: Diagnosis not present

## 2022-07-07 HISTORY — PX: REVERSE SHOULDER ARTHROPLASTY: SHX5054

## 2022-07-07 LAB — ABO/RH: ABO/RH(D): A POS

## 2022-07-07 SURGERY — ARTHROPLASTY, SHOULDER, TOTAL, REVERSE
Anesthesia: Regional | Site: Shoulder | Laterality: Left

## 2022-07-07 MED ORDER — VANCOMYCIN HCL 1000 MG IV SOLR
INTRAVENOUS | Status: AC
  Filled 2022-07-07: qty 20

## 2022-07-07 MED ORDER — OXYCODONE HCL 5 MG/5ML PO SOLN: 5.0000 mg | Freq: Once | ORAL | Status: DC | PRN

## 2022-07-07 MED ORDER — ORAL CARE MOUTH RINSE: 15.0000 mL | Freq: Once | OROMUCOSAL | Status: AC

## 2022-07-07 MED ORDER — EPHEDRINE 5 MG/ML INJ
INTRAVENOUS | Status: AC
  Filled 2022-07-07: qty 5

## 2022-07-07 MED ORDER — DEXAMETHASONE SODIUM PHOSPHATE 10 MG/ML IJ SOLN
INTRAMUSCULAR | Status: AC
  Filled 2022-07-07: qty 1

## 2022-07-07 MED ORDER — VANCOMYCIN HCL 1000 MG IV SOLR
INTRAVENOUS | Status: DC | PRN
  Administered 2022-07-07: 1000 mg

## 2022-07-07 MED ORDER — FENTANYL CITRATE (PF) 100 MCG/2ML IJ SOLN
INTRAMUSCULAR | Status: AC
  Administered 2022-07-07: 100 ug via INTRAVENOUS
  Filled 2022-07-07: qty 2

## 2022-07-07 MED ORDER — TRANEXAMIC ACID-NACL 1000-0.7 MG/100ML-% IV SOLN
1000.0000 mg | INTRAVENOUS | Status: AC
  Administered 2022-07-07: 1000 mg via INTRAVENOUS
  Filled 2022-07-07: qty 100

## 2022-07-07 MED ORDER — METOPROLOL TARTRATE 5 MG/5ML IV SOLN
INTRAVENOUS | Status: AC
  Filled 2022-07-07: qty 5

## 2022-07-07 MED ORDER — ROCURONIUM BROMIDE 10 MG/ML (PF) SYRINGE
PREFILLED_SYRINGE | INTRAVENOUS | Status: AC
  Filled 2022-07-07: qty 10

## 2022-07-07 MED ORDER — CEFAZOLIN SODIUM-DEXTROSE 2-4 GM/100ML-% IV SOLN
2.0000 g | INTRAVENOUS | Status: AC
  Administered 2022-07-07: 2 g via INTRAVENOUS
  Filled 2022-07-07: qty 100

## 2022-07-07 MED ORDER — LACTATED RINGERS IV SOLN: INTRAVENOUS | Status: DC

## 2022-07-07 MED ORDER — HYDROMORPHONE HCL 1 MG/ML IJ SOLN: 0.2500 mg | INTRAMUSCULAR | Status: DC | PRN

## 2022-07-07 MED ORDER — POVIDONE-IODINE 10 % EX SOLN
CUTANEOUS | Status: DC | PRN
  Administered 2022-07-07: 1 via TOPICAL

## 2022-07-07 MED ORDER — 0.9 % SODIUM CHLORIDE (POUR BTL) OPTIME
TOPICAL | Status: DC | PRN
  Administered 2022-07-07: 1000 mL

## 2022-07-07 MED ORDER — ONDANSETRON HCL 4 MG/2ML IJ SOLN
INTRAMUSCULAR | Status: DC | PRN
  Administered 2022-07-07: 4 mg via INTRAVENOUS

## 2022-07-07 MED ORDER — MIDAZOLAM HCL 2 MG/2ML IJ SOLN
INTRAMUSCULAR | Status: AC
  Administered 2022-07-07: 2 mg via INTRAVENOUS
  Filled 2022-07-07: qty 2

## 2022-07-07 MED ORDER — ALBUTEROL SULFATE (2.5 MG/3ML) 0.083% IN NEBU: 2.5000 mg | INHALATION_SOLUTION | Freq: Once | RESPIRATORY_TRACT | Status: DC

## 2022-07-07 MED ORDER — FENTANYL CITRATE (PF) 100 MCG/2ML IJ SOLN: 100.0000 ug | Freq: Once | INTRAMUSCULAR | Status: AC

## 2022-07-07 MED ORDER — BUPIVACAINE LIPOSOME 1.3 % IJ SUSP
INTRAMUSCULAR | Status: DC | PRN
  Administered 2022-07-07: 10 mL via PERINEURAL

## 2022-07-07 MED ORDER — EPHEDRINE SULFATE-NACL 50-0.9 MG/10ML-% IV SOSY
PREFILLED_SYRINGE | INTRAVENOUS | Status: DC | PRN
  Administered 2022-07-07 (×2): 5 mg via INTRAVENOUS

## 2022-07-07 MED ORDER — ROCURONIUM BROMIDE 10 MG/ML (PF) SYRINGE
PREFILLED_SYRINGE | INTRAVENOUS | Status: DC | PRN
  Administered 2022-07-07: 20 mg via INTRAVENOUS
  Administered 2022-07-07: 90 mg via INTRAVENOUS

## 2022-07-07 MED ORDER — BUPIVACAINE HCL (PF) 0.5 % IJ SOLN
INTRAMUSCULAR | Status: DC | PRN
  Administered 2022-07-07: 20 mL via PERINEURAL

## 2022-07-07 MED ORDER — ONDANSETRON HCL 4 MG/2ML IJ SOLN
INTRAMUSCULAR | Status: AC
  Filled 2022-07-07: qty 2

## 2022-07-07 MED ORDER — ACETAMINOPHEN 500 MG PO TABS
1000.0000 mg | ORAL_TABLET | Freq: Once | ORAL | Status: AC
  Administered 2022-07-07: 1000 mg via ORAL
  Filled 2022-07-07: qty 2

## 2022-07-07 MED ORDER — FENTANYL CITRATE (PF) 250 MCG/5ML IJ SOLN
INTRAMUSCULAR | Status: DC | PRN
  Administered 2022-07-07: 50 ug via INTRAVENOUS

## 2022-07-07 MED ORDER — FENTANYL CITRATE (PF) 250 MCG/5ML IJ SOLN
INTRAMUSCULAR | Status: AC
  Filled 2022-07-07: qty 5

## 2022-07-07 MED ORDER — CHLORHEXIDINE GLUCONATE 0.12 % MT SOLN
15.0000 mL | Freq: Once | OROMUCOSAL | Status: AC
  Administered 2022-07-07: 15 mL via OROMUCOSAL
  Filled 2022-07-07: qty 15

## 2022-07-07 MED ORDER — LIDOCAINE 2% (20 MG/ML) 5 ML SYRINGE
INTRAMUSCULAR | Status: AC
  Filled 2022-07-07: qty 5

## 2022-07-07 MED ORDER — GABAPENTIN 300 MG PO CAPS
300.0000 mg | ORAL_CAPSULE | Freq: Once | ORAL | Status: AC
  Administered 2022-07-07: 300 mg via ORAL
  Filled 2022-07-07: qty 1

## 2022-07-07 MED ORDER — PHENYLEPHRINE HCL-NACL 20-0.9 MG/250ML-% IV SOLN
INTRAVENOUS | Status: DC | PRN
  Administered 2022-07-07: 100 ug/min via INTRAVENOUS

## 2022-07-07 MED ORDER — PROPOFOL 10 MG/ML IV BOLUS
INTRAVENOUS | Status: DC | PRN
  Administered 2022-07-07: 150 mg via INTRAVENOUS

## 2022-07-07 MED ORDER — AMISULPRIDE (ANTIEMETIC) 5 MG/2ML IV SOLN: 10.0000 mg | Freq: Once | INTRAVENOUS | Status: DC | PRN

## 2022-07-07 MED ORDER — OXYCODONE HCL 5 MG PO TABS: 5.0000 mg | ORAL_TABLET | Freq: Once | ORAL | Status: DC | PRN

## 2022-07-07 MED ORDER — MIDAZOLAM HCL 2 MG/2ML IJ SOLN: 2.0000 mg | Freq: Once | INTRAMUSCULAR | Status: AC

## 2022-07-07 MED ORDER — SODIUM CHLORIDE 0.9 % IR SOLN
Status: DC | PRN
  Administered 2022-07-07: 1000 mL
  Administered 2022-07-07: 3000 mL

## 2022-07-07 MED ORDER — PROPOFOL 10 MG/ML IV BOLUS
INTRAVENOUS | Status: AC
  Filled 2022-07-07: qty 20

## 2022-07-07 MED ORDER — ALBUTEROL SULFATE (2.5 MG/3ML) 0.083% IN NEBU
INHALATION_SOLUTION | RESPIRATORY_TRACT | Status: AC
  Filled 2022-07-07: qty 3

## 2022-07-07 MED ORDER — METOPROLOL TARTRATE 5 MG/5ML IV SOLN
INTRAVENOUS | Status: DC | PRN
  Administered 2022-07-07: 2 mg via INTRAVENOUS

## 2022-07-07 MED ORDER — MEPERIDINE HCL 25 MG/ML IJ SOLN: 6.2500 mg | INTRAMUSCULAR | Status: DC | PRN

## 2022-07-07 MED ORDER — LIDOCAINE 2% (20 MG/ML) 5 ML SYRINGE
INTRAMUSCULAR | Status: DC | PRN
  Administered 2022-07-07: 40 mg via INTRAVENOUS

## 2022-07-07 MED ORDER — SUGAMMADEX SODIUM 200 MG/2ML IV SOLN
INTRAVENOUS | Status: DC | PRN
  Administered 2022-07-07: 350 mg via INTRAVENOUS

## 2022-07-07 MED ORDER — ALBUTEROL SULFATE HFA 108 (90 BASE) MCG/ACT IN AERS: 2.0000 | INHALATION_SPRAY | RESPIRATORY_TRACT | Status: DC

## 2022-07-07 MED ORDER — PROMETHAZINE HCL 25 MG/ML IJ SOLN: 6.2500 mg | INTRAMUSCULAR | Status: DC | PRN

## 2022-07-07 SURGICAL SUPPLY — 66 items
BAG COUNTER SPONGE SURGICOUNT (BAG) ×1 IMPLANT
BASEPLATE GLENOSPHERE 25 STD (Miscellaneous) IMPLANT
BIT DRILL 3.2 PERIPHERAL SCREW (BIT) IMPLANT
CHLORAPREP W/TINT 26 (MISCELLANEOUS) ×1 IMPLANT
COOLER ICEMAN CLASSIC (MISCELLANEOUS) ×1 IMPLANT
COVER SURGICAL LIGHT HANDLE (MISCELLANEOUS) ×1 IMPLANT
CUP HUM INSERT SHLD 3/4 39 +0 (Cup) IMPLANT
DRAPE IMP U-DRAPE 54X76 (DRAPES) ×1 IMPLANT
DRAPE INCISE IOBAN 66X45 STRL (DRAPES) ×1 IMPLANT
DRAPE U-SHAPE 47X51 STRL (DRAPES) ×2 IMPLANT
DRSG AQUACEL AG ADV 3.5X10 (GAUZE/BANDAGES/DRESSINGS) ×1 IMPLANT
ELECT BLADE 4.0 EZ CLEAN MEGAD (MISCELLANEOUS) ×1
ELECT REM PT RETURN 9FT ADLT (ELECTROSURGICAL) ×1
ELECTRODE BLDE 4.0 EZ CLN MEGD (MISCELLANEOUS) ×1 IMPLANT
ELECTRODE REM PT RTRN 9FT ADLT (ELECTROSURGICAL) ×1 IMPLANT
GLOVE BIO SURGEON STRL SZ7.5 (GLOVE) ×3 IMPLANT
GLOVE BIOGEL PI IND STRL 6.5 (GLOVE) ×1 IMPLANT
GLOVE BIOGEL PI IND STRL 8 (GLOVE) ×2 IMPLANT
GLOVE ECLIPSE 6.0 STRL STRAW (GLOVE) ×1 IMPLANT
GLOVE INDICATOR 8.0 STRL GRN (GLOVE) ×1 IMPLANT
GOWN STRL REUS W/ TWL LRG LVL3 (GOWN DISPOSABLE) ×2 IMPLANT
GOWN STRL REUS W/TWL LRG LVL3 (GOWN DISPOSABLE) ×2
GUIDE PIN 3X75 SHOULDER (PIN) ×1
GUIDEPIN HUM 3X100 (PIN) IMPLANT
GUIDEWIRE GLENOID 2.5X220 (WIRE) IMPLANT
HANDPIECE INTERPULSE COAX TIP (DISPOSABLE) ×1
HEAD GLENOSP CANN REV SHLD 39 (Head) IMPLANT
KIT BASIN OR (CUSTOM PROCEDURE TRAY) ×1 IMPLANT
KIT STABILIZATION SHOULDER (MISCELLANEOUS) ×1 IMPLANT
KIT TURNOVER KIT B (KITS) ×1 IMPLANT
MANIFOLD NEPTUNE II (INSTRUMENTS) ×1 IMPLANT
NDL HYPO 21X1 ECLIPSE (NEEDLE) ×1 IMPLANT
NDL MAYO TROCAR (NEEDLE) ×1 IMPLANT
NEEDLE HYPO 21X1 ECLIPSE (NEEDLE) ×1 IMPLANT
NEEDLE MAYO TROCAR (NEEDLE) ×2 IMPLANT
NS IRRIG 1000ML POUR BTL (IV SOLUTION) ×1 IMPLANT
PACK SHOULDER (CUSTOM PROCEDURE TRAY) ×1 IMPLANT
PACK UNIVERSAL I (CUSTOM PROCEDURE TRAY) ×1 IMPLANT
PAD ARMBOARD 7.5X6 YLW CONV (MISCELLANEOUS) ×2 IMPLANT
PAD COLD SHLDR WRAP-ON (PAD) ×1 IMPLANT
PIN GUIDE 3X75 SHOULDER (PIN) IMPLANT
RESTRAINT HEAD UNIVERSAL NS (MISCELLANEOUS) ×1 IMPLANT
SCREW 5.0X18 (Screw) IMPLANT
SCREW 5.5X22 (Screw) IMPLANT
SCREW 5.5X26 (Screw) IMPLANT
SCREW BONE INTRNL SM 7 (Screw) IMPLANT
SET HNDPC FAN SPRY TIP SCT (DISPOSABLE) ×1 IMPLANT
SLING ARM IMMOBILIZER LRG (SOFTGOODS) ×1 IMPLANT
SPONGE T-LAP 18X18 ~~LOC~~+RFID (SPONGE) ×1 IMPLANT
STAPLER VISISTAT 35W (STAPLE) ×1 IMPLANT
STEM HUMERAL STD SHORT SZ3 (Joint) IMPLANT
SUCTION FRAZIER HANDLE 10FR (MISCELLANEOUS) ×1
SUCTION TUBE FRAZIER 10FR DISP (MISCELLANEOUS) ×1 IMPLANT
SUT ETHILON 3 0 PS 1 (SUTURE) IMPLANT
SUT FIBERWIRE #2 38 T-5 BLUE (SUTURE) ×4
SUT FIBERWIRE #5 38 CONV NDL (SUTURE) ×2
SUT VIC AB 0 CT1 27 (SUTURE) ×2
SUT VIC AB 0 CT1 27XBRD ANBCTR (SUTURE) ×2 IMPLANT
SUT VIC AB 2-0 CT1 27 (SUTURE) ×2
SUT VIC AB 2-0 CT1 TAPERPNT 27 (SUTURE) ×2 IMPLANT
SUTURE FIBERWR #2 38 T-5 BLUE (SUTURE) IMPLANT
SUTURE FIBERWR #5 38 CONV NDL (SUTURE) ×2 IMPLANT
SYR 50ML LL SCALE MARK (SYRINGE) ×1 IMPLANT
TOWEL GREEN STERILE (TOWEL DISPOSABLE) ×1 IMPLANT
TRAY FOLEY MTR SLVR 16FR STAT (SET/KITS/TRAYS/PACK) ×1 IMPLANT
WATER STERILE IRR 1000ML POUR (IV SOLUTION) ×1 IMPLANT

## 2022-07-07 NOTE — Interval H&P Note (Signed)
History and Physical Interval Note:  07/07/2022 1:02 PM  Trevor Daniels  has presented today for surgery, with the diagnosis of LEFT IRREPARABLE [place].  The various methods of treatment have been discussed with the patient and family. After consideration of risks, benefits and other options for treatment, the patient has consented to  Procedure(s): LEFT REVERSE SHOULDER ARTHROPLASTY (Left) as a surgical intervention.  The patient's history has been reviewed, patient examined, no change in status, stable for surgery.  I have reviewed the patient's chart and labs.  Questions were answered to the patient's satisfaction.     Vanetta Mulders

## 2022-07-07 NOTE — Discharge Instructions (Addendum)
     Discharge Instructions    Attending Surgeon: Vanetta Mulders, MD Office Phone Number: [phone]   Diagnosis and Procedures:    Surgeries Performed: Left shoulder reverse arthroplast  Discharge Plan:    Diet: Resume usual diet. Begin with light or bland foods.  Drink plenty of fluids.  Activity:  Keep sling and dressing in place until your follow up visit in Physical Therapy You are advised to go home directly from the hospital or surgical center. Restrict your activities.  GENERAL INSTRUCTIONS: 1.  Keep your surgical site elevated above your heart for at least 5-7 days or longer to prevent swelling. This will improve your comfort and your overall recovery following surgery.     2. Please call Dr. Eddie Dibbles office at [phone] with questions Monday-Friday during business hours. If no one answers, please leave a message and someone should get back to the patient within 24 hours. For emergencies please call 911 or proceed to the emergency room.   3. Patient to notify surgical team if experiences any of the following: Bowel/Bladder dysfunction, uncontrolled pain, nerve/muscle weakness, incision with increased drainage or redness, nausea/vomiting and Fever greater than 101.0 F.  Be alert for signs of infection including redness, streaking, odor, fever or chills. Be alert for excessive pain or bleeding and notify your surgeon immediately.  WOUND INSTRUCTIONS:   Leave your dressing/cast/splint in place until your post operative visit.  Keep it clean and dry.  Always keep the incision clean and dry until the staples/sutures are removed. If there is no drainage from the incision you should keep it open to air. If there is drainage from the incision you must keep it covered at all times until the drainage stops  Do not soak in a bath tub, hot tub, pool, lake or other body of water until 21 days after your surgery and your incision is completely dry and healed.  If you have  removable sutures (or staples) they must be removed 10-14 days (unless otherwise instructed) from the day of your surgery.     1)  Elevate the extremity as much as possible.  2)  Keep the dressing clean and dry.  3)  Please call us if the dressing becomes wet or dirty.  4)  If you are experiencing worsening pain or worsening swelling, please call.     MEDICATIONS: Resume all previous home medications at the previous prescribed dose and frequency unless otherwise noted Start taking the  pain medications on an as-needed basis as prescribed  Please taper down pain medication over the next week following surgery.  Ideally you should not require a refill of any narcotic pain medication.  Take pain medication with food to minimize nausea. In addition to the prescribed pain medication, you may take over-the-counter pain relievers such as Tylenol.  Do NOT take additional tylenol if your pain medication already has tylenol in it.  Aspirin 325mg  daily for four weeks.      FOLLOWUP INSTRUCTIONS: 1. Follow up at the Physical Therapy Clinic 3-4 days following surgery. This appointment should be scheduled unless other arrangements have been made.The Physical Therapy scheduling number is [phone] if an appointment has not already been arranged.  2. Contact Dr. Eddie Dibbles office during office hours at [phone] or the practice after hours line at [phone] for non-emergencies. For medical emergencies call 911.   Discharge Location: Home

## 2022-07-07 NOTE — Progress Notes (Signed)
Patient resting comfortably post-regional block. Heart rate tachying up to 140s occasionally, sinus rhythm with sinus arrhythmia. Anesthesia made aware. No new orders at this time.

## 2022-07-07 NOTE — Op Note (Deleted)
Chief Complaint: Left shoulder pseudoparalysis        History of Present Illness:      JARRION BURGESON is a 66 y.o. male right-hand-dominant male presents with left shoulder pain and limited motion since Jan 26, 2022.  He states that at that time he had a fall directly on the shoulder when he landed off of a ladder onto the elbow.  Since that time he has had essentially no range of motion or function of the left shoulder.  He is here today as a referral from Dr.Xu as he was found to have a large rotator cuff tear.  He states that he does have a history of right shoulder rotator cuff tear which is treated with conservative management is overall doing quite well.  He is here today as he states the left shoulder feels different.  He is essentially not able to use the left arm whatsoever.  He is taking Tylenol.  He is having a very difficult time laying on the sleep.  He still does work part-time in [employer] community and as a result has to do heavy lifting which she has not been able to do as a result of the shoulder.  He does not smoke.  He is on 81 mg aspirin.       Surgical History:   None   PMH/PSH/Family History/Social History/Meds/Allergies:         Past Medical History:  Diagnosis Date   Hypertension      No past surgical history on file. Social History         Socioeconomic History   Marital status: Single      Spouse name: Not on file   Number of children: Not on file   Years of education: Not on file   Highest education level: Not on file  Occupational History   Not on file  Tobacco Use   Smoking status: Never   Smokeless tobacco: Not on file  Substance and Sexual Activity   Alcohol use: Never   Drug use: Never   Sexual activity: Not on file  Other Topics Concern   Not on file  Social History Narrative   Not on file    Social Determinants of Health    Financial Resource Strain: Not on file  Food Insecurity: Not on file  Transportation Needs:  Not on file  Physical Activity: Not on file  Stress: Not on file  Social Connections: Not on file    No family history on file. No Known Allergies       Current Outpatient Medications  Medication Sig Dispense Refill   acetaminophen (TYLENOL) 500 MG tablet Take 1 tablet (500 mg total) by mouth every 8 (eight) hours for 10 days. 30 tablet 0   aspirin EC 325 MG tablet Take 1 tablet (325 mg total) by mouth daily. 30 tablet 0   ibuprofen (ADVIL) 800 MG tablet Take 1 tablet (800 mg total) by mouth every 8 (eight) hours for 10 days. Please take with food, please alternate with acetaminophen 30 tablet 0   oxyCODONE (OXY IR/ROXICODONE) 5 MG immediate release tablet Take 1 tablet (5 mg total) by mouth every 4 (four) hours as needed (severe pain). 20 tablet 0   lisinopril (ZESTRIL) 20 MG tablet Take 20 mg by mouth daily.        No current facility-administered medications for this visit.    Imaging Results (Last 48 hours)  No results found.  Review of Systems:   A ROS was performed including pertinent positives and negatives as documented in the HPI.   Physical Exam :   Constitutional: NAD and appears stated age Neurological: Alert and oriented Psych: Appropriate affect and cooperative There were no vitals taken for this visit.    Comprehensive Musculoskeletal Exam:     Musculoskeletal Exam      Inspection Right Left  Skin No atrophy or winging No atrophy or winging  Palpation      Tenderness None Lateral deltoid  Range of Motion      Flexion (passive) 170 100  Flexion (active) 170 10  Abduction 170 10  ER at the side 70 0  Can reach behind back to T12 Back pocket  Strength        Full 3/5, negative belly press  Special Tests      Pseudoparalytic No No  Neurologic      Fires PIN, radial, median, ulnar, musculocutaneous, axillary, suprascapular, long thoracic, and spinal accessory innervated muscles. No abnormal sensibility  Vascular/Lymphatic      Radial Pulse 2+ 2+   Cervical Exam      Patient has symmetric cervical range of motion with negative Spurling's test.  Special Test:         Imaging:   Xray (3 views left shoulder): Mild AC joint arthritis   MRI (left shoulder): Full-thickness massive rotator cuff tear involving the supra and infraspinatus with retraction beyond the level of the glenoid and significant atrophy on T1 imaging involving both the supraspinatus and infraspinatus   I personally reviewed and interpreted the radiographs.     Assessment:   66 y.o. male right-hand-dominant male presents with left shoulder pseudoparalysis following a massive rotator cuff injury on May 15 where he fell off of a ladder.  At today's visit I did discuss treatment options with him.  Given the fact that he does have completely nonfunctional left shoulder I do believe that surgical treatment is necessary in order to optimize his ability to use the arm.  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  He is not able to lift that arm at all away from the side.  I discussed with him treatment options including rotator cuff repair versus superior capsular reconstruction versus reverse shoulder arthroplasty.  Given the fact that his tear is very large and retracted beyond the glenoid with significant fatty atrophy I do believe that he would be less likely to benefit from a rotator cuff repair.  This impacts given the fact that he does have essentially pseudoparalysis I do believe that a superior capsule reconstruction would be less helpful for him.  At this time I believe that a reverse shoulder arthroplasty would give him the quickest and best likelihood of recovery.  I did discuss with him the specific risks associated with this including hardware failure or infection.  He would like to proceed with this.   Plan :     -Plan for left shoulder reverse shoulder arthroplasty     After a lengthy  discussion of treatment options, including risks, benefits, alternatives, complications of surgical and nonsurgical conservative options, the patient elected surgical repair.    The patient  is aware of the material risks  and complications including, but not limited to injury to adjacent structures, neurovascular injury, infection, numbness, bleeding, implant failure, thermal burns, stiffness, persistent pain, failure to heal, disease transmission from allograft,  need for further surgery, dislocation, anesthetic risks, blood clots, risks of death,and others. The probabilities of surgical success and failure discussed with patient given their particular co-morbidities.The time and nature of expected rehabilitation and recovery was discussed.The patient's questions were all answered preoperatively.  No barriers to understanding were noted. I explained the natural history of the disease process and Rx rationale.  I explained to the patient what I considered to be reasonable expectations given their personal situation.  The final treatment plan was arrived at through a shared patient decision making process model.           I personally saw and evaluated the patient, and participated in the management and treatment plan.   Vanetta Mulders, MD Attending Physician, Orthopedic Surgery

## 2022-07-07 NOTE — Anesthesia Procedure Notes (Signed)
Anesthesia Regional Block: Interscalene brachial plexus block   Pre-Anesthetic Checklist: , timeout performed,  Correct Patient, Correct Site, Correct Laterality,  Correct Procedure, Correct Position, site marked,  Risks and benefits discussed,  Surgical consent,  Pre-op evaluation,  At surgeon's request and post-op pain management  Laterality: Left  Prep: chloraprep       Needles:  Injection technique: Single-shot  Needle Type: Stimiplex     Needle Length: 9cm  Needle Gauge: 21     Additional Needles:   Procedures:,,,, ultrasound used (permanent image in chart),,    Narrative:  Start time: 07/07/2022 1:13 PM End time: 07/07/2022 1:18 PM Injection made incrementally with aspirations every 5 mL.  Performed by: Personally  Anesthesiologist: Lynda Rainwater, MD

## 2022-07-07 NOTE — Brief Op Note (Signed)
   Brief Op Note  Date of Surgery: 07/07/2022  Preoperative Diagnosis: LEFT IRREPARABLE ROTATOR CUFF TEAR  Postoperative Diagnosis: same  Procedure: Procedure(s): LEFT REVERSE SHOULDER ARTHROPLASTY  Implants: Implant Name Type Inv. Item Serial No. Manufacturer Lot No. LRB No. Used Action  BASEPLATE GLENOSPHERE 25 STD - [id number] Miscellaneous BASEPLATE GLENOSPHERE 25 STD [id number] [employer]  Left 1 Implanted  SCREW BONE INTRNL SM 7 - [id number] Screw SCREW BONE INTRNL SM 7 [id number] [employer]  Left 1 Implanted  Cannulated CoCr Standard Glenosphere   [id number] [employer]  Left 1 Implanted  STEM HUMERAL STD SHORT SZ3 - [id number] Joint STEM HUMERAL STD SHORT SZ3 [id number] [employer]  Left 1 Implanted  Retentive Reversed Insert,   [id number]   Left 1 Implanted    Surgeons: Surgeon(s): Vanetta Mulders, MD  Anesthesia: Regional    Estimated Blood Loss: See anesthesia record  Complications: None  Condition to PACU: Stable  Yevonne Pax, MD 07/07/2022 4:09 PM

## 2022-07-07 NOTE — Anesthesia Procedure Notes (Signed)
Procedure Name: Intubation Date/Time: 07/07/2022 2:11 PM  Performed by: Michele Rockers, CRNAPre-anesthesia Checklist: Patient identified, Patient being monitored, Timeout performed, Emergency Drugs available and Suction available Patient Re-evaluated:Patient Re-evaluated prior to induction Oxygen Delivery Method: Circle System Utilized Preoxygenation: Pre-oxygenation with 100% oxygen Induction Type: IV induction Ventilation: Mask ventilation without difficulty Laryngoscope Size: Miller and 2 Grade View: Grade I Tube type: Oral Tube size: 7.5 mm Number of attempts: 1 Airway Equipment and Method: Stylet Placement Confirmation: ETT inserted through vocal cords under direct vision, positive ETCO2 and breath sounds checked- equal and bilateral Secured at: 22 cm Tube secured with: Tape Dental Injury: Teeth and Oropharynx as per pre-operative assessment

## 2022-07-07 NOTE — Anesthesia Postprocedure Evaluation (Signed)
Anesthesia Post Note  Patient: Aidian Salomon Statler  Procedure(s) Performed: LEFT REVERSE SHOULDER ARTHROPLASTY (Left: Shoulder)     Patient location during evaluation: PACU Anesthesia Type: Regional and General Level of consciousness: awake and alert Pain management: pain level controlled Vital Signs Assessment: post-procedure vital signs reviewed and stable Respiratory status: spontaneous breathing, nonlabored ventilation and respiratory function stable Cardiovascular status: blood pressure returned to baseline and stable Postop Assessment: no apparent nausea or vomiting Anesthetic complications: no   No notable events documented.  Last Vitals:  Vitals:   07/07/22 1630 07/07/22 1645  BP: (!) 155/87 132/86  Pulse: (!) 105 (!) 104  Resp: 18 19  Temp:  36.5 C  SpO2: 97% 95%    Last Pain:  Vitals:   07/07/22 1645  TempSrc:   PainSc: 0-No pain                 Lynda Rainwater

## 2022-07-07 NOTE — H&P (Signed)
    Chief Complaint: Left shoulder pseudoparalysis        History of Present Illness:      Trevor Daniels is a 65 y.o. male right-hand-dominant male presents with left shoulder pain and limited motion since Jan 26, 2022.  He states that at that time he had a fall directly on the shoulder when he landed off of a ladder onto the elbow.  Since that time he has had essentially no range of motion or function of the left shoulder.  He is here today as a referral from Dr.Xu as he was found to have a large rotator cuff tear.  He states that he does have a history of right shoulder rotator cuff tear which is treated with conservative management is overall doing quite well.  He is here today as he states the left shoulder feels different.  He is essentially not able to use the left arm whatsoever.  He is taking Tylenol.  He is having a very difficult time laying on the sleep.  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  He does not smoke.  He is on 81 mg aspirin.       Surgical History:   None   PMH/PSH/Family History/Social History/Meds/Allergies:         Past Medical History:  Diagnosis Date   Hypertension      No past surgical history on file. Social History         Socioeconomic History   Marital status: Single      Spouse name: Not on file   Number of children: Not on file   Years of education: Not on file   Highest education level: Not on file  Occupational History   Not on file  Tobacco Use   Smoking status: Never   Smokeless tobacco: Not on file  Substance and Sexual Activity   Alcohol use: Never   Drug use: Never   Sexual activity: Not on file  Other Topics Concern   Not on file  Social History Narrative   Not on file    Social Determinants of Health    Financial Resource Strain: Not on file  Food Insecurity: Not on file  Transportation Needs:  Not on file  Physical Activity: Not on file  Stress: Not on file  Social Connections: Not on file    No family history on file. No Known Allergies       Current Outpatient Medications  Medication Sig Dispense Refill   acetaminophen (TYLENOL) 500 MG tablet Take 1 tablet (500 mg total) by mouth every 8 (eight) hours for 10 days. 30 tablet 0   aspirin EC 325 MG tablet Take 1 tablet (325 mg total) by mouth daily. 30 tablet 0   ibuprofen (ADVIL) 800 MG tablet Take 1 tablet (800 mg total) by mouth every 8 (eight) hours for 10 days. Please take with food, please alternate with acetaminophen 30 tablet 0   oxyCODONE (OXY IR/ROXICODONE) 5 MG immediate release tablet Take 1 tablet (5 mg total) by mouth every 4 (four) hours as needed (severe pain). 20 tablet 0   lisinopril (ZESTRIL) 20 MG tablet Take 20 mg by mouth daily.        No current facility-administered medications for this visit.    Imaging Results (Last 48 hours)  No results found.       Review of Systems:   A ROS was performed including pertinent positives and negatives as documented in the HPI.   Physical Exam :   Constitutional: NAD and appears stated age Neurological: Alert and oriented Psych: Appropriate affect and cooperative There were no vitals taken for this visit.    Comprehensive Musculoskeletal Exam:     Musculoskeletal Exam      Inspection Right Left  Skin No atrophy or winging No atrophy or winging  Palpation      Tenderness None Lateral deltoid  Range of Motion      Flexion (passive) 170 100  Flexion (active) 170 10  Abduction 170 10  ER at the side 70 0  Can reach behind back to T12 Back pocket  Strength        Full 3/5, negative belly press  Special Tests      Pseudoparalytic No No  Neurologic      Fires PIN, radial, median, ulnar, musculocutaneous, axillary, suprascapular, long thoracic, and spinal accessory innervated muscles. No abnormal sensibility  Vascular/Lymphatic      Radial Pulse 2+ 2+   Cervical Exam      Patient has symmetric cervical range of motion with negative Spurling's test.  Special Test:         Imaging:   Xray (3 views left shoulder): Mild AC joint arthritis   MRI (left shoulder): Full-thickness massive rotator cuff tear involving the supra and infraspinatus with retraction beyond the level of the glenoid and significant atrophy on T1 imaging involving both the supraspinatus and infraspinatus   I personally reviewed and interpreted the radiographs.     Assessment:   66 y.o. male right-hand-dominant male presents with left shoulder pseudoparalysis following a massive rotator cuff injury on May 15 where he fell off of a ladder.  At today's visit I did discuss treatment options with him.  Given the fact that he does have completely nonfunctional left shoulder I do believe that surgical treatment is necessary in order to optimize his ability to use the arm.  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  He is not able to lift that arm at all away from the side.  I discussed with him treatment options including rotator cuff repair versus superior capsular reconstruction versus reverse shoulder arthroplasty.  Given the fact that his tear is very large and retracted beyond the glenoid with significant fatty atrophy I do believe that he would be less likely to benefit from a rotator cuff repair.  This impacts given the fact that he does have essentially pseudoparalysis I do believe that a superior capsule reconstruction would be less helpful for him.  At this time I believe that a reverse shoulder arthroplasty would give him the quickest and best likelihood of recovery.  I did discuss with him the specific risks associated with this including hardware failure or infection.  He would like to proceed with this.   Plan :     -Plan for left shoulder reverse shoulder arthroplasty     After a lengthy  discussion of treatment options, including risks, benefits, alternatives, complications of surgical and nonsurgical conservative options, the patient elected surgical repair.    The patient  is aware of the material risks  and complications including, but not limited to injury to adjacent structures, neurovascular injury, infection, numbness, bleeding, implant failure, thermal burns, stiffness, persistent pain, failure to heal, disease transmission from allograft,  need for further surgery, dislocation, anesthetic risks, blood clots, risks of death,and others. The probabilities of surgical success and failure discussed with patient given their particular co-morbidities.The time and nature of expected rehabilitation and recovery was discussed.The patient's questions were all answered preoperatively.  No barriers to understanding were noted. I explained the natural history of the disease process and Rx rationale.  I explained to the patient what I considered to be reasonable expectations given their personal situation.  The final treatment plan was arrived at through a shared patient decision making process model.           I personally saw and evaluated the patient, and participated in the management and treatment plan.   Elyza Whitt, MD Attending Physician, Orthopedic Surgery 

## 2022-07-07 NOTE — Transfer of Care (Signed)
Immediate Anesthesia Transfer of Care Note  Patient: Trevor Daniels  Procedure(s) Performed: LEFT REVERSE SHOULDER ARTHROPLASTY (Left: Shoulder)  Patient Location: PACU  Anesthesia Type:General and Regional  Level of Consciousness: awake and alert   Airway & Oxygen Therapy: Patient Spontanous Breathing and Patient connected to face mask oxygen  Post-op Assessment: Report given to RN and Post -op Vital signs reviewed and stable  Post vital signs: Reviewed and stable  Last Vitals:  Vitals Value Taken Time  BP 137/61 07/07/22 1616  Temp 36.5 C 07/07/22 1615  Pulse 108 07/07/22 1621  Resp 16 07/07/22 1621  SpO2 94 % 07/07/22 1621  Vitals shown include unvalidated device data.  Last Pain:  Vitals:   07/07/22 1615  TempSrc:   PainSc: 0-No pain      Patients Stated Pain Goal: 0 (87/68/11 [id number])  Complications: No notable events documented.

## 2022-07-07 NOTE — Op Note (Signed)
Date of Surgery: 07/07/2022  INDICATIONS: Trevor Daniels is a 66 y.o.-year-old male with left pseudoparalysis in the setting of a massive nonrepairable rotator cuff tear.  The risk and benefits of the procedure were discussed in detail and documented in the pre-operative evaluation.   PREOPERATIVE DIAGNOSIS: 1.  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  PROCEDURE: 1.  Left reverse shoulder arthroplasty 2.  Left shoulder biceps tenodesis  SURGEON: Benancio Deeds MD  ASSISTANT: Kerby Less, ATC  ANESTHESIA:  general plus interscalene nerve block  IV FLUIDS AND URINE: See anesthesia record.  ANTIBIOTICS: Ancef  ESTIMATED BLOOD LOSS: 100 mL.  IMPLANTS:  Implant Name Type Inv. Item Serial No. Manufacturer Lot No. LRB No. Used Action  BASEPLATE GLENOSPHERE 25 STD - [id number] Miscellaneous BASEPLATE GLENOSPHERE 25 STD [id number] [employer]  Left 1 Implanted  SCREW BONE INTRNL SM 7 - [id number] Screw SCREW BONE INTRNL SM 7 [id number] [employer]  Left 1 Implanted  Cannulated CoCr Standard Glenosphere   [id number] [employer]  Left 1 Implanted  STEM HUMERAL STD SHORT SZ3 - [id number] Joint STEM HUMERAL STD SHORT SZ3 [id number] [employer]  Left 1 Implanted  Retentive Reversed Insert,   [id number]   Left 1 Implanted    DRAINS: None  CULTURES: None  COMPLICATIONS: none  DESCRIPTION OF PROCEDURE:  Patient was identified in the preoperative holding area.  Anesthesia performed an interscalene nerve block after universal timeout was performed with nursing.  Ancef was given 1 hour prior to skin incision.    The surgical site was scrubbed with a chlorhexidine scrub brush and alcohol.  The patient was then prepped with chlorhexidine skin prep.  The patient was subsequently taken back to the operating room.  Anesthesia was induced.  He was transferred to the beachchair position.  All bony prominences were padded.  Final timeout  was again performed.     The bony landmarks of the shoulder were marked with a marking pen. A delto-pectoral incision was made, extending up approximately 5 inches. The wound with then irrigated with dilute betadine. Cephalic vein was identified, and an protected. This was retracted medially. Subdeltoid and subpectoral lesions were released. Neurovascular structures were carefully protected. The Gelpi retractor was used to retract the deltoid and pectoralis major. A 1 cm release was performed on the upper pectoralis.   The deltoid was retracted laterally with a Brown humeral retractor.  The conjoined tendon was identified. The cleido-pectoral fascia was excised.  The axillary nerve was palpated and carefully protected throughout the procedure. The biceps tendon was found and tenodesed to the upper pec with # 2 FiberWire.  Proximally the biceps tendon was removed up to the joint.  The bicipital groove was used for a landmark to establish rotator cuff interval. The subscap was tagged with a #2 FiberWire.  At this point the subscap was peeled off from the lesser tuberosity with care to avoid dissection distally in order to protect the axillary nerve.  Once the joint was exposed the proximal humerus was delivered with external rotation and extension of the arm. The humerus was prepped initially by performing a humeral neck cut. This was done with the guide using 20 degrees of retroversion as a reference.  The head portion was removed.  A medullary sounding reamer was then used.  We subsequently placed our guidewire through the center of the humeral head using the reference guide.  This was a size 3.  Metaphyseal reamer was  then used.  Finally the size 3 broach was malleted into place with excellent purchase.  A tonsil clamp was used to attempt to pull this out with very good purchase   Attention was then turned to the glenoid.  Posteriorly a large Darach retractor was used.  A 360 Degree release of the  subscapularis and glenoid were done. The capsule was released from the humerus.    Glenoid retractors were placed posteriorly, superiorly behind the biceps tendon and anteriorly on the glenoid neck. A 360-degree release of the capsule was performed with cautery.  The triceps was released off the inferior tubercle of the glenoid. The axillary nerve was carefully protected with the surgeon's index finger, retracting it and using cautery.   A guidepin was placed through the glenoid guide. The guidepin was drilled until it exited the cortex. The guidepin was over drilled. Next, the glenoid was prepared with the reamer  down to cortical bone.  The central peg hole was totally within the scapular neck tested with the probe.  The baseplate was then placed screwed securely with good purchase in position and then secured with 4 screws. In each case, they were drilled and measured and the appropriate length screw placed with excellent rigid fixation of the baseplate.    A +3 liner was then used with the appropriate broach.  This was brought to just the level of the reduction but not completely reduced.  A +3 final poly was selected and impacted.    Appropriate tension was noted on the conjoined tendon and deltoid muscle.  Extension was stable, external and internal rotation as well.  The subscap was pulled over but as this was not able to reach comfortably decision was made not to repair in order to prevent limited in external rotation.  The wound was then irrigated. Vancomycin powder was placed in the wound again for infection prevention.   The wound was then closed in layers with 0 Vicryl interrupted in the deep subcu followed by 2-0 Vicryl and 3-0 nylon for skin.  An Aquacel dressing was applied as well as an Naval architect.  A shoulder immobilizer was applied.         POSTOPERATIVE PLAN: He will begin physical therapy and early active range of motion.  I will see him back in 2 weeks for wound check.  He will  be placed on aspirin for blood clot prevention for the following month.  Yevonne Pax, MD 4:09 PM

## 2022-07-13 ENCOUNTER — Ambulatory Visit: Payer: Medicare HMO | Attending: Orthopaedic Surgery | Admitting: Physical Therapy

## 2022-07-13 ENCOUNTER — Encounter (HOSPITAL_COMMUNITY): Payer: Self-pay | Admitting: Orthopaedic Surgery

## 2022-07-13 DIAGNOSIS — R252 Cramp and spasm: Secondary | ICD-10-CM | POA: Diagnosis present

## 2022-07-13 DIAGNOSIS — M12812 Other specific arthropathies, not elsewhere classified, left shoulder: Secondary | ICD-10-CM | POA: Insufficient documentation

## 2022-07-13 DIAGNOSIS — M25612 Stiffness of left shoulder, not elsewhere classified: Secondary | ICD-10-CM | POA: Diagnosis present

## 2022-07-13 DIAGNOSIS — R293 Abnormal posture: Secondary | ICD-10-CM | POA: Insufficient documentation

## 2022-07-13 DIAGNOSIS — M6281 Muscle weakness (generalized): Secondary | ICD-10-CM | POA: Insufficient documentation

## 2022-07-13 DIAGNOSIS — M25512 Pain in left shoulder: Secondary | ICD-10-CM | POA: Diagnosis not present

## 2022-07-13 NOTE — Therapy (Signed)
OUTPATIENT PHYSICAL THERAPY SHOULDER EVALUATION   Patient Name: Trevor Daniels MRN: [id number] DOB:02-May-1956, 66 y.o., male Today's Date: 07/13/2022   PT End of Session - 07/13/22 0934     Visit Number 1    Number of Visits 20    Date for PT Re-Evaluation 09/21/22    Authorization Type [employer] MCR; pending authorization    Progress Note Due on Visit 10    PT Start Time 0940    PT Stop Time 1015    PT Time Calculation (min) 35 min    Activity Tolerance Patient tolerated treatment well    Behavior During Therapy WFL for tasks assessed/performed             Past Medical History:  Diagnosis Date   COPD (chronic obstructive pulmonary disease) ([place])    Coronary artery disease    Calcifications seen on CT   Hypertension    Past Surgical History:  Procedure Laterality Date   EYE SURGERY     66 years old   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/07/2022   Procedure: LEFT REVERSE SHOULDER ARTHROPLASTY;  Surgeon: Vanetta Mulders, MD;  Location: [facility];  Service: Orthopedics;  Laterality: Left;   TONSILLECTOMY     Patient Active Problem List   Diagnosis Date Noted   Rotator cuff arthropathy of left shoulder 07/07/2022   Acute pain of left shoulder 03/19/2022    PCP: Candida Peeling, PA-C   REFERRING PROVIDER: Tonny Bollman, MD  REFERRING DIAG: L reverse total shoulder arthroscopy  THERAPY DIAG:  Acute pain of left shoulder  Stiffness of left shoulder, not elsewhere classified  Muscle weakness (generalized)  Cramp and spasm  Abnormal posture  Rationale for Evaluation and Treatment Rehabilitation  ONSET DATE: 07/07/2022  SUBJECTIVE:                                                                                                                                                                                      SUBJECTIVE STATEMENT: Trevor Daniels had a L reverse TSA on 07/07/22.  He fell off a ladder in may and ripped his rotator cuff but was not a candidate rotator  cuff repair.  Since May has not been able to lift arm or reach out.  Having more pain now that nerve block has worn off.   Just taking ibuprofen and tylenol right now to manage pain.   He has had some numbness in hand and aching in forearm before the surgery, surgeon said thinks has some residual nerve damage from the fall.    PERTINENT HISTORY: COPD, HTN, R shoulder RTC tear  PAIN:  Are you having pain? Yes: NPRS scale: 0-7/10  Pain location: L shoulder Pain description: sharp shooting when moves wrong, no pain currently  Aggravating factors: moving wrong Relieving factors: pain medication  PRECAUTIONS: Shoulder  WEIGHT BEARING RESTRICTIONS:  see post-surgical protocol  FALLS:  Has patient fallen in last 6 months? Yes. Number of falls 1 off a ladder while trimming tall bush, ladder broke  LIVING ENVIRONMENT: Lives with: lives with their family Lives in: House/apartment Stairs: Yes: External: 2 steps; none Has following equipment at home: None  OCCUPATION: Retired, was working part time before accident  PLOF: Independent  PATIENT GOALS:get regular use of my arm back  OBJECTIVE:   DIAGNOSTIC FINDINGS:  07/07/2022 DG Left Shoulder IMPRESSION: Post left total shoulder replacement without evidence of complication.  PATIENT SURVEYS:  Quick Dash 54.5% disability   COGNITION: Overall cognitive status: Within functional limits for tasks assessed     SENSATION: Light touch: Impaired   slightly diminished in L hand  POSTURE: Rounded shoulders  UPPER EXTREMITY ROM:   Active ROM Right eval Left Eval NT*  Shoulder flexion 155   Shoulder extension 84   Shoulder abduction 160   Shoulder adduction    Shoulder internal rotation 60   Shoulder external rotation 80   (Blank rows = not tested) *NT due to post-surgical precautions  UPPER EXTREMITY MMT:  MMT Right eval Left Eval NT*  Shoulder flexion 5   Shoulder extension 5   Shoulder abduction 5   Shoulder adduction  5   Shoulder internal rotation 5   Shoulder external rotation 5   Elbow flexion 5   Elbow extension 5   Wrist flexion 5   Wrist extension 5   Wrist pronation 5   Wrist supination 5   Grip strength  good   (Blank rows = not tested) *NT due to post-surgical precautions.   SHOULDER SPECIAL TESTS: NA  JOINT MOBILITY TESTING:  NA  PALPATION:  Incision covered with hydrocolloid bandage, no signs of infection.     TODAY'S TREATMENT:                                                                                                                           DATE: 06/24/2022- Self care: see patient education.    PATIENT EDUCATION: Education details: POC, initial HEP, precautions.  Issued yellow theraputty.  Person educated: Patient Education method: Explanation, Demonstration, Verbal cues, and Handouts Education comprehension: verbalized understanding and returned demonstration  HOME EXERCISE PROGRAM: Access Code: [email or web address] URL: [email or web address] Date: 07/13/2022 Prepared by: Harrie Foreman  Exercises - Seated Scapular Retraction  - 3 x daily - 7 x weekly - 1 sets - 10 reps - 3-5 sec hold - Putty Squeezes  - 3 x daily - 7 x weekly - 1 sets - 10 reps  ASSESSMENT:  CLINICAL IMPRESSION: Trevor Daniels  is a 66 y.o. right hand dominant male who was seen today for physical therapy evaluation and treatment for s/p L shoulder reverse TSA on 07/07/2022. He reports good pain  control and no complications.  He is concerned about copay.  He arrived in shoulder sling and was aware of his precautions, including no active shoulder ROM, continue to wear sling, keeping bandage dry, avoid any reaching behind with L UE.  He was issued yellow theraputty for grip strenghtneing and given very gentle scap squeezes, no pain reported.  He would benefit from skilled physical therapy to improve L shoulder strength, ROM, in order to be able to use L arm and transition to gym based exercise  program.     OBJECTIVE IMPAIRMENTS: decreased endurance, decreased ROM, decreased strength, hypomobility, increased edema, increased fascial restrictions, impaired perceived functional ability, increased muscle spasms, impaired flexibility, impaired UE functional use, postural dysfunction, and pain.   ACTIVITY LIMITATIONS: carrying, lifting, bending, sleeping, transfers, toileting, dressing, self feeding, reach over head, and hygiene/grooming  PARTICIPATION LIMITATIONS: meal prep, cleaning, laundry, and driving  PERSONAL FACTORS: 1-2 comorbidities: COPD, HTN  are also affecting patient's functional outcome.   REHAB POTENTIAL: Good  CLINICAL DECISION MAKING: Stable/uncomplicated  EVALUATION COMPLEXITY: Low  GOALS: Goals reviewed with patient? Yes  SHORT TERM GOALS: Target date: 08/18/2022   Patient will be independent with initial HEP.  Baseline: given Goal status: INITIAL  2.  Patient will demonstrate PROM L shoulder of 140 deg flexion, 60 deg ER, and abduction to 120 deg to progress to next stage of protocol.   Baseline: NT today due to post-surgical protocol.  Goal status: INITIAL  3. Patient will report 75% improvement in L shoulder pain to improve QOL.  Baseline: 7/10 when moves wrong Goal status: INITIAL  LONG TERM GOALS: Target date:  09/29/2022  Patient will be independent with advanced/ongoing HEP to improve outcomes and carryover.  Baseline: needs progression  Goal status: INITIAL  2.  Patient to improve left shoulder AROM to [facility] without pain provocation to allow for increased ease of ADLs.  Baseline: NA Goal status: INITIAL  3.  Patient will demonstrate improved functional left UE strength as demonstrated by 4/5 to progress strengthening program. Baseline: NA Goal status: INITIAL  4.   Patient will report 10% improvement on QuickDash  to demonstrate improved functional ability.  Baseline: 54.5% disability Goal status: INITIAL  5.  Patient will feel confident  with transition to gym based exercise program to continue strengthening of L shoulder    Baseline:  Goal status: INITIAL    PLAN:  PT FREQUENCY: 1-2x/week prefers 1x/week due to copay  PT DURATION: 12 weeks  PLANNED INTERVENTIONS: Therapeutic exercises, Therapeutic activity, Neuromuscular re-education, Balance training, Gait training, Patient/Family education, Self Care, Joint mobilization, Dry Needling, Electrical stimulation, Spinal mobilization, Cryotherapy, Moist heat, Vasopneumatic device, Ultrasound, Ionotophoresis 4mg /ml Dexamethasone, Manual therapy, and Re-evaluation  PLAN FOR NEXT SESSION: progress per protocol. Table slides.    , PT, DPT  07/13/2022, 10:42 AM

## 2022-07-16 ENCOUNTER — Encounter: Payer: Medicare HMO | Admitting: Physical Therapy

## 2022-07-20 ENCOUNTER — Ambulatory Visit: Payer: Medicare HMO | Attending: Orthopaedic Surgery

## 2022-07-20 DIAGNOSIS — M25512 Pain in left shoulder: Secondary | ICD-10-CM | POA: Diagnosis present

## 2022-07-20 DIAGNOSIS — M25612 Stiffness of left shoulder, not elsewhere classified: Secondary | ICD-10-CM | POA: Diagnosis present

## 2022-07-20 DIAGNOSIS — R252 Cramp and spasm: Secondary | ICD-10-CM | POA: Insufficient documentation

## 2022-07-20 DIAGNOSIS — R293 Abnormal posture: Secondary | ICD-10-CM | POA: Diagnosis present

## 2022-07-20 DIAGNOSIS — M6281 Muscle weakness (generalized): Secondary | ICD-10-CM | POA: Diagnosis present

## 2022-07-20 NOTE — Therapy (Signed)
OUTPATIENT PHYSICAL THERAPY SHOULDER TREATMENT   Patient Name: Trevor Daniels MRN: [id number] DOB:1955/12/08, 66 y.o., male Today's Date: 07/20/2022   PT End of Session - 07/20/22 1201     Visit Number 2    Number of Visits 20    Date for PT Re-Evaluation 09/29/22    Authorization Type [employer] MCR    Authorization Time Period 07/13/22- 09/13/22    Authorization - Visit Number 2    Authorization - Number of Visits 16    Progress Note Due on Visit 10    PT Start Time 1100    PT Stop Time 1145    PT Time Calculation (min) 45 min    Activity Tolerance Patient tolerated treatment well    Behavior During Therapy WFL for tasks assessed/performed              Past Medical History:  Diagnosis Date   COPD (chronic obstructive pulmonary disease) ([facility])    Coronary artery disease    Calcifications seen on CT   Hypertension    Past Surgical History:  Procedure Laterality Date   EYE SURGERY     66 years old   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/07/2022   Procedure: LEFT REVERSE SHOULDER ARTHROPLASTY;  Surgeon: Huel Cote, MD;  Location: [facility];  Service: Orthopedics;  Laterality: Left;   TONSILLECTOMY     Patient Active Problem List   Diagnosis Date Noted   Rotator cuff arthropathy of left shoulder 07/07/2022   Acute pain of left shoulder 03/19/2022    PCP: Darryll Capers, PA-C   REFERRING PROVIDER: Cristal Deer, MD  REFERRING DIAG: L reverse total shoulder arthroscopy  THERAPY DIAG:  Acute pain of left shoulder  Stiffness of left shoulder, not elsewhere classified  Muscle weakness (generalized)  Cramp and spasm  Abnormal posture  Rationale for Evaluation and Treatment Rehabilitation  ONSET DATE: 07/07/2022  SUBJECTIVE:                                                                                                                                                                                      SUBJECTIVE STATEMENT: Pt notes the other night he went  to get up from recliner and accidentally pushed through L elbow and ever since has had some aching along the top of shoulder.   PERTINENT HISTORY: COPD, HTN, R shoulder RTC tear  PAIN:  Are you having pain? Yes: NPRS scale: 2-3/10 Pain location: L shoulder Pain description: dull ache  Aggravating factors: moving wrong Relieving factors: pain medication  PRECAUTIONS: Shoulder  WEIGHT BEARING RESTRICTIONS:  see post-surgical protocol  FALLS:  Has patient fallen in last 6 months? Yes. Number of  falls 1 off a ladder while trimming tall bush, ladder broke  LIVING ENVIRONMENT: Lives with: lives with their family Lives in: House/apartment Stairs: Yes: External: 2 steps; none Has following equipment at home: None  OCCUPATION: Retired, was working part time before accident  PLOF: Independent  PATIENT GOALS:get regular use of my arm back  OBJECTIVE:   DIAGNOSTIC FINDINGS:  07/07/2022 DG Left Shoulder IMPRESSION: Post left total shoulder replacement without evidence of complication.  PATIENT SURVEYS:  Quick Dash 54.5% disability   COGNITION: Overall cognitive status: Within functional limits for tasks assessed     SENSATION: Light touch: Impaired   slightly diminished in L hand  POSTURE: Rounded shoulders  UPPER EXTREMITY ROM:   Active ROM Right eval Left Eval NT*  Shoulder flexion 155   Shoulder extension 84   Shoulder abduction 160   Shoulder adduction    Shoulder internal rotation 60   Shoulder external rotation 80   (Blank rows = not tested) *NT due to post-surgical precautions  UPPER EXTREMITY MMT:  MMT Right eval Left Eval NT*  Shoulder flexion 5   Shoulder extension 5   Shoulder abduction 5   Shoulder adduction 5   Shoulder internal rotation 5   Shoulder external rotation 5   Elbow flexion 5   Elbow extension 5   Wrist flexion 5   Wrist extension 5   Wrist pronation 5   Wrist supination 5   Grip strength  good   (Blank rows = not tested)  *NT due to post-surgical precautions.   SHOULDER SPECIAL TESTS: NA  JOINT MOBILITY TESTING:  NA  PALPATION:  Incision covered with hydrocolloid bandage, no signs of infection.     TODAY'S TREATMENT:                                                                                                                           DATE: 07/20/22 TherEx: Seated shoulder squeezes 2 x 10  Seated shoulder rolls x 10 Seated wrist flexion and extension x 10 Seated shoulder flexion and scaption table slides x 10 L UE passive Seated AA flexion with green yoga ball L UE passive x 10 Seated ER AAROM with wand x 10 Manual Therapy: PROM to L shoulder per protocol  06/24/2022- Self care: see patient education.    PATIENT EDUCATION: Education details: POC, initial HEP, precautions.  Issued yellow theraputty.  Person educated: Patient Education method: Explanation, Demonstration, Verbal cues, and Handouts Education comprehension: verbalized understanding and returned demonstration  HOME EXERCISE PROGRAM: Access Code: [email or web address] URL: [email or web address] Date: 07/20/2022 Prepared by: Verta Ellen  Exercises - Seated Scapular Retraction  - 3 x daily - 7 x weekly - 1 sets - 10 reps - 3-5 sec hold - Putty Squeezes  - 3 x daily - 7 x weekly - 1 sets - 10 reps - Wrist Extension AROM  - 1 x daily - 7 x weekly - 3 sets - 10 reps - Wrist Flexion AROM  -  1 x daily - 7 x weekly - 3 sets - 10 reps - Seated Shoulder External Rotation AAROM with Dowel  - 1 x daily - 7 x weekly - 2 sets - 10 reps - Seated Shoulder Flexion Towel Slide at Table Top  - 1 x daily - 7 x weekly - 2 sets - 10 reps - Seated Shoulder Abduction Towel Slide at Table Top  - 1 x daily - 7 x weekly - 2 sets - 10 reps  ASSESSMENT:  CLINICAL IMPRESSION: Pt arrives today with no issues of increased pain in L shoulder. HE had an instance of accidentally pushing through L UE to get up from recliner, so we reviewed his post op  precautions again today. Progressed PROM/AAROM within protocol limits, he was very guarded with the table slides and PROM. He reports that he will visit Dr. Sammuel Hines this Wednesday again.  OBJECTIVE IMPAIRMENTS: decreased endurance, decreased ROM, decreased strength, hypomobility, increased edema, increased fascial restrictions, impaired perceived functional ability, increased muscle spasms, impaired flexibility, impaired UE functional use, postural dysfunction, and pain.   ACTIVITY LIMITATIONS: carrying, lifting, bending, sleeping, transfers, toileting, dressing, self feeding, reach over head, and hygiene/grooming  PARTICIPATION LIMITATIONS: meal prep, cleaning, laundry, and driving  PERSONAL FACTORS: 1-2 comorbidities: COPD, HTN  are also affecting patient's functional outcome.   REHAB POTENTIAL: Good  CLINICAL DECISION MAKING: Stable/uncomplicated  EVALUATION COMPLEXITY: Low  GOALS: Goals reviewed with patient? Yes  SHORT TERM GOALS: Target date: 08/18/2022   Patient will be independent with initial HEP.  Baseline: given Goal status: INITIAL  2.  Patient will demonstrate PROM L shoulder of 140 deg flexion, 60 deg ER, and abduction to 120 deg to progress to next stage of protocol.   Baseline: NT today due to post-surgical protocol.  Goal status: INITIAL  3. Patient will report 75% improvement in L shoulder pain to improve QOL.  Baseline: 7/10 when moves wrong Goal status: INITIAL  LONG TERM GOALS: Target date:  09/29/2022  Patient will be independent with advanced/ongoing HEP to improve outcomes and carryover.  Baseline: needs progression  Goal status: INITIAL  2.  Patient to improve left shoulder AROM to [facility] without pain provocation to allow for increased ease of ADLs.  Baseline: NA Goal status: INITIAL  3.  Patient will demonstrate improved functional left UE strength as demonstrated by 4/5 to progress strengthening program. Baseline: NA Goal status: INITIAL  4.    Patient will report 10% improvement on QuickDash  to demonstrate improved functional ability.  Baseline: 54.5% disability Goal status: INITIAL  5.  Patient will feel confident with transition to gym based exercise program to continue strengthening of L shoulder    Baseline:  Goal status: INITIAL    PLAN:  PT FREQUENCY: 1-2x/week prefers 1x/week due to copay  PT DURATION: 12 weeks  PLANNED INTERVENTIONS: Therapeutic exercises, Therapeutic activity, Neuromuscular re-education, Balance training, Gait training, Patient/Family education, Self Care, Joint mobilization, Dry Needling, Electrical stimulation, Spinal mobilization, Cryotherapy, Moist heat, Vasopneumatic device, Ultrasound, Ionotophoresis 4mg /ml Dexamethasone, Manual therapy, and Re-evaluation  PLAN FOR NEXT SESSION: progress per protocol. Try pendulums   Artist Pais, PTA 07/20/2022, 12:02 PM

## 2022-07-22 ENCOUNTER — Ambulatory Visit (INDEPENDENT_AMBULATORY_CARE_PROVIDER_SITE_OTHER): Payer: Medicare HMO

## 2022-07-22 ENCOUNTER — Ambulatory Visit (INDEPENDENT_AMBULATORY_CARE_PROVIDER_SITE_OTHER): Payer: Medicare HMO | Admitting: Orthopaedic Surgery

## 2022-07-22 DIAGNOSIS — M12812 Other specific arthropathies, not elsewhere classified, left shoulder: Secondary | ICD-10-CM

## 2022-07-22 NOTE — Progress Notes (Signed)
Post Operative Evaluation    Procedure/Date of Surgery: Left reverse shoulder arthoplasty 10/24  Interval History:    Presents today 2-week status post above procedure.  Overall he is doing very well at today's visit.  He has minimal pain.  He has not yet been working on passive or active range of motion.  He has been compliant with aspirin usage.  Denies any numbness or loss of sensation in the hand or arm.   PMH/PSH/Family History/Social History/Meds/Allergies:    Past Medical History:  Diagnosis Date   COPD (chronic obstructive pulmonary disease) ([facility])    Coronary artery disease    Calcifications seen on CT   Hypertension    Past Surgical History:  Procedure Laterality Date   EYE SURGERY     66 years old   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/07/2022   Procedure: LEFT REVERSE SHOULDER ARTHROPLASTY;  Surgeon: Huel Cote, MD;  Location: [facility];  Service: Orthopedics;  Laterality: Left;   TONSILLECTOMY     Social History   Socioeconomic History   Marital status: Single    Spouse name: Not on file   Number of children: Not on file   Years of education: Not on file   Highest education level: Not on file  Occupational History   Not on file  Tobacco Use   Smoking status: Never   Smokeless tobacco: Never  Vaping Use   Vaping Use: Never used  Substance and Sexual Activity   Alcohol use: Yes    Comment: per pt-maybe a beer occ   Drug use: Never   Sexual activity: Not on file  Other Topics Concern   Not on file  Social History Narrative   Not on file   Social Determinants of Health   Financial Resource Strain: Not on file  Food Insecurity: Not on file  Transportation Needs: Not on file  Physical Activity: Not on file  Stress: Not on file  Social Connections: Not on file   No family history on file. No Known Allergies Current Outpatient Medications  Medication Sig Dispense Refill   albuterol (VENTOLIN HFA) 108 (90 Base)  MCG/ACT inhaler Inhale 2 puffs into the lungs 4 (four) times daily as needed for shortness of breath or wheezing.     aspirin EC 325 MG tablet Take 1 tablet (325 mg total) by mouth daily. 30 tablet 0   BREZTRI AEROSPHERE 160-9-4.8 MCG/ACT AERO Inhale 2 puffs into the lungs in the morning and at bedtime.     ibuprofen (ADVIL) 200 MG tablet Take 400 mg by mouth 2 (two) times daily as needed (pain.).     ibuprofen (ADVIL) 800 MG tablet Take 800 mg by mouth every 8 (eight) hours as needed (pain.).     lisinopril (ZESTRIL) 40 MG tablet Take 20 mg by mouth in the morning.     oxyCODONE (OXY IR/ROXICODONE) 5 MG immediate release tablet Take 1 tablet (5 mg total) by mouth every 4 (four) hours as needed (severe pain). 20 tablet 0   Vitamin D, Ergocalciferol, (DRISDOL) 1.25 MG (50000 UNIT) CAPS capsule Take 50,000 Units by mouth every Thursday.     No current facility-administered medications for this visit.   No results found.  Review of Systems:   A ROS was performed including pertinent positives and negatives as documented in the HPI.  Musculoskeletal Exam:    There were no vitals taken for this visit.  Left incision is well-appearing without erythema or drainage.  Passive forward elevation of the spine position is to 90 degrees.  Passive external rotation is to 20 degrees.  Stable to flex and extend the left elbow.  Distal neurosensory exam is intact  Imaging:    3 views left shoulder: Status post reverse shoulder arthroplasty without evidence of complication  I personally reviewed and interpreted the radiographs.   Assessment:   2 weeks status post left reverse shoulder arthroplasty overall doing extremely well.  At this time he will continue with active and active assisted range of motion.  I have cautioned that he should wear his sling only at nighttime at this point.  He will continue to progress according to the reverse shoulder protocol.  All restrictions and limitations Plan :     -Return in 4 weeks for reassessment      I personally saw and evaluated the patient, and participated in the management and treatment plan.  Huel Cote, MD Attending Physician, Orthopedic Surgery  This document was dictated using Dragon voice recognition software. A reasonable attempt at proof reading has been made to minimize errors.

## 2022-07-27 ENCOUNTER — Ambulatory Visit: Payer: Medicare HMO | Admitting: Physical Therapy

## 2022-08-03 ENCOUNTER — Ambulatory Visit: Payer: Medicare HMO

## 2022-08-03 DIAGNOSIS — R252 Cramp and spasm: Secondary | ICD-10-CM

## 2022-08-03 DIAGNOSIS — R293 Abnormal posture: Secondary | ICD-10-CM

## 2022-08-03 DIAGNOSIS — M25512 Pain in left shoulder: Secondary | ICD-10-CM

## 2022-08-03 DIAGNOSIS — M6281 Muscle weakness (generalized): Secondary | ICD-10-CM

## 2022-08-03 DIAGNOSIS — M25612 Stiffness of left shoulder, not elsewhere classified: Secondary | ICD-10-CM

## 2022-08-03 NOTE — Therapy (Signed)
OUTPATIENT PHYSICAL THERAPY SHOULDER TREATMENT   Patient Name: Trevor Daniels MRN: [id number] DOB:1955-09-30, 66 y.o., male Today's Date: 08/03/2022   PT End of Session - 08/03/22 1153     Visit Number 3    Number of Visits 20    Date for PT Re-Evaluation 09/29/22    Authorization Type [employer] MCR    Authorization Time Period 07/13/22- 09/13/22    Authorization - Visit Number 3    Authorization - Number of Visits 16    Progress Note Due on Visit 10    PT Start Time 1104    PT Stop Time 1145    PT Time Calculation (min) 41 min    Activity Tolerance Patient tolerated treatment well    Behavior During Therapy WFL for tasks assessed/performed               Past Medical History:  Diagnosis Date   COPD (chronic obstructive pulmonary disease) (HCC)    Coronary artery disease    Calcifications seen on CT   Hypertension    Past Surgical History:  Procedure Laterality Date   EYE SURGERY     66 years old   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/07/2022   Procedure: LEFT REVERSE SHOULDER ARTHROPLASTY;  Surgeon: Huel Cote, MD;  Location: [facility] OR;  Service: Orthopedics;  Laterality: Left;   TONSILLECTOMY     Patient Active Problem List   Diagnosis Date Noted   Rotator cuff arthropathy of left shoulder 07/07/2022   Acute pain of left shoulder 03/19/2022    PCP: Darryll Capers, PA-C   REFERRING PROVIDER: Cristal Deer, MD  REFERRING DIAG: L reverse total shoulder arthroscopy  THERAPY DIAG:  Acute pain of left shoulder  Stiffness of left shoulder, not elsewhere classified  Muscle weakness (generalized)  Cramp and spasm  Abnormal posture  Rationale for Evaluation and Treatment Rehabilitation  ONSET DATE: 07/07/2022  SUBJECTIVE:                                                                                                                                                                                      SUBJECTIVE STATEMENT: Pt reports last MD visit going  well, pain is about a 3/10 this morning  PERTINENT HISTORY: COPD, HTN, R shoulder RTC tear  PAIN:  Are you having pain? Yes: NPRS scale: 3/10 Pain location: L shoulder Pain description: dull ache  Aggravating factors: moving wrong Relieving factors: pain medication  PRECAUTIONS: Shoulder  WEIGHT BEARING RESTRICTIONS:  see post-surgical protocol  FALLS:  Has patient fallen in last 6 months? Yes. Number of falls 1 off a ladder while trimming tall bush, ladder broke  LIVING ENVIRONMENT: Lives with:  lives with their family Lives in: House/apartment Stairs: Yes: External: 2 steps; none Has following equipment at home: None  OCCUPATION: Retired, was working part time before accident  PLOF: Independent  PATIENT GOALS:get regular use of my arm back  OBJECTIVE:   DIAGNOSTIC FINDINGS:  07/07/2022 DG Left Shoulder IMPRESSION: Post left total shoulder replacement without evidence of complication.  PATIENT SURVEYS:  Quick Dash 54.5% disability   COGNITION: Overall cognitive status: Within functional limits for tasks assessed     SENSATION: Light touch: Impaired   slightly diminished in L hand  POSTURE: Rounded shoulders  UPPER EXTREMITY ROM:   Active ROM Right eval Left Eval NT* Left 08/03/22  Shoulder flexion 155  90  Shoulder extension 84    Shoulder abduction 160  88  Shoulder adduction     Shoulder internal rotation 60    Shoulder external rotation 80  19  (Blank rows = not tested) *NT due to post-surgical precautions  UPPER EXTREMITY MMT:  MMT Right eval Left Eval NT*  Shoulder flexion 5   Shoulder extension 5   Shoulder abduction 5   Shoulder adduction 5   Shoulder internal rotation 5   Shoulder external rotation 5   Elbow flexion 5   Elbow extension 5   Wrist flexion 5   Wrist extension 5   Wrist pronation 5   Wrist supination 5   Grip strength  good   (Blank rows = not tested) *NT due to post-surgical precautions.   SHOULDER SPECIAL  TESTS: NA  JOINT MOBILITY TESTING:  NA  PALPATION:  Incision covered with hydrocolloid bandage, no signs of infection.     TODAY'S TREATMENT:                                                                                                                           DATE:   08/03/22 Therapeutic Exercise: to improve strength and mobility.  Demo, verbal and tactile cues throughout for technique.  Seated ball slides green yoga ball 2x10 flexion and abduction Supine shoulder flexion, scaption, ER all with wand 2x10 each PROM to L shoulder all directions per protocol Pulleys flexion x 2 min  07/20/22 TherEx: Seated shoulder squeezes 2 x 10  Seated shoulder rolls x 10 Seated wrist flexion and extension x 10 Seated shoulder flexion and scaption table slides x 10 L UE passive Seated AA flexion with green yoga ball L UE passive x 10 Seated ER AAROM with wand x 10 Manual Therapy: PROM to L shoulder per protocol  06/24/2022- Self care: see patient education.    PATIENT EDUCATION: Education details: POC, initial HEP, precautions.  Issued yellow theraputty.  Person educated: Patient Education method: Explanation, Demonstration, Verbal cues, and Handouts Education comprehension: verbalized understanding and returned demonstration  HOME EXERCISE PROGRAM: Access Code: [email or web address] URL: [email or web address] Date: 07/20/2022 Prepared by: Verta Ellen  Exercises - Seated Scapular Retraction  - 3 x daily - 7 x weekly - 1 sets - 10 reps -  3-5 sec hold - Putty Squeezes  - 3 x daily - 7 x weekly - 1 sets - 10 reps - Wrist Extension AROM  - 1 x daily - 7 x weekly - 3 sets - 10 reps - Wrist Flexion AROM  - 1 x daily - 7 x weekly - 3 sets - 10 reps - Seated Shoulder External Rotation AAROM with Dowel  - 1 x daily - 7 x weekly - 2 sets - 10 reps - Seated Shoulder Flexion Towel Slide at Table Top  - 1 x daily - 7 x weekly - 2 sets - 10 reps - Seated Shoulder Abduction Towel Slide at  Table Top  - 1 x daily - 7 x weekly - 2 sets - 10 reps  ASSESSMENT:  CLINICAL IMPRESSION: Pt responded well to treatment. He is now cleared to do more AAROM/AROM with L shoulder and able to be out of sling but still to sleep with sling. Progressed ROM exercises with only mild reports of discomfort. Instructed on use of home pulley system as well. Provided updated HEP to focus solely on L shoulder ROM to progress as indicated by protocol.  OBJECTIVE IMPAIRMENTS: decreased endurance, decreased ROM, decreased strength, hypomobility, increased edema, increased fascial restrictions, impaired perceived functional ability, increased muscle spasms, impaired flexibility, impaired UE functional use, postural dysfunction, and pain.   ACTIVITY LIMITATIONS: carrying, lifting, bending, sleeping, transfers, toileting, dressing, self feeding, reach over head, and hygiene/grooming  PARTICIPATION LIMITATIONS: meal prep, cleaning, laundry, and driving  PERSONAL FACTORS: 1-2 comorbidities: COPD, HTN  are also affecting patient's functional outcome.   REHAB POTENTIAL: Good  CLINICAL DECISION MAKING: Stable/uncomplicated  EVALUATION COMPLEXITY: Low  GOALS: Goals reviewed with patient? Yes  SHORT TERM GOALS: Target date: 08/18/2022   Patient will be independent with initial HEP.  Baseline: given Goal status: IN PROGRESS  2.  Patient will demonstrate PROM L shoulder of 140 deg flexion, 60 deg ER, and abduction to 120 deg to progress to next stage of protocol.   Baseline: NT today due to post-surgical protocol.  Goal status: IN PROGRESS  3. Patient will report 75% improvement in L shoulder pain to improve QOL.  Baseline: 7/10 when moves wrong Goal status: INITIAL  LONG TERM GOALS: Target date:  09/29/2022  Patient will be independent with advanced/ongoing HEP to improve outcomes and carryover.  Baseline: needs progression  Goal status: IN PROGRESS  2.  Patient to improve left shoulder AROM to [facility]  without pain provocation to allow for increased ease of ADLs.  Baseline: NA Goal status: IN PROGRESS  3.  Patient will demonstrate improved functional left UE strength as demonstrated by 4/5 to progress strengthening program. Baseline: NA Goal status: IN PROGRESS  4.   Patient will report 10% improvement on QuickDash  to demonstrate improved functional ability.  Baseline: 54.5% disability Goal status: IN PROGRESS  5.  Patient will feel confident with transition to gym based exercise program to continue strengthening of L shoulder    Baseline:  Goal status: IN PROGRESS    PLAN:  PT FREQUENCY: 1-2x/week prefers 1x/week due to copay  PT DURATION: 12 weeks  PLANNED INTERVENTIONS: Therapeutic exercises, Therapeutic activity, Neuromuscular re-education, Balance training, Gait training, Patient/Family education, Self Care, Joint mobilization, Dry Needling, Electrical stimulation, Spinal mobilization, Cryotherapy, Moist heat, Vasopneumatic device, Ultrasound, Ionotophoresis 4mg /ml Dexamethasone, Manual therapy, and Re-evaluation  PLAN FOR NEXT SESSION: progress per protocol. Try pendulums   , PTA 08/03/2022, 11:54 AM

## 2022-08-10 ENCOUNTER — Encounter: Payer: Self-pay | Admitting: Physical Therapy

## 2022-08-10 ENCOUNTER — Ambulatory Visit: Payer: Medicare HMO | Admitting: Physical Therapy

## 2022-08-10 DIAGNOSIS — M6281 Muscle weakness (generalized): Secondary | ICD-10-CM

## 2022-08-10 DIAGNOSIS — M25512 Pain in left shoulder: Secondary | ICD-10-CM

## 2022-08-10 DIAGNOSIS — R293 Abnormal posture: Secondary | ICD-10-CM

## 2022-08-10 DIAGNOSIS — R252 Cramp and spasm: Secondary | ICD-10-CM

## 2022-08-10 DIAGNOSIS — M25612 Stiffness of left shoulder, not elsewhere classified: Secondary | ICD-10-CM

## 2022-08-10 NOTE — Therapy (Signed)
OUTPATIENT PHYSICAL THERAPY SHOULDER TREATMENT   Patient Name: Trevor Daniels MRN: [id number] DOB:February 24, 1956, 66 y.o., male Today's Date: 08/10/2022   PT End of Session - 08/10/22 1111     Visit Number 4    Number of Visits 20    Date for PT Re-Evaluation 09/29/22    Authorization Type [employer] MCR    Authorization Time Period 07/13/22- 09/13/22    Authorization - Number of Visits 16    Progress Note Due on Visit 10    PT Start Time 1106    PT Stop Time 1152    PT Time Calculation (min) 46 min    Activity Tolerance Patient tolerated treatment well    Behavior During Therapy WFL for tasks assessed/performed               Past Medical History:  Diagnosis Date   COPD (chronic obstructive pulmonary disease) ([place])    Coronary artery disease    Calcifications seen on CT   Hypertension    Past Surgical History:  Procedure Laterality Date   EYE SURGERY     66 years old   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/07/2022   Procedure: LEFT REVERSE SHOULDER ARTHROPLASTY;  Surgeon: Vanetta Mulders, MD;  Location: [place];  Service: Orthopedics;  Laterality: Left;   TONSILLECTOMY     Patient Active Problem List   Diagnosis Date Noted   Rotator cuff arthropathy of left shoulder 07/07/2022   Acute pain of left shoulder 03/19/2022    PCP: Candida Peeling, PA-C   REFERRING PROVIDER: Tonny Bollman, MD  REFERRING DIAG: L reverse total shoulder arthroscopy  THERAPY DIAG:  Acute pain of left shoulder  Stiffness of left shoulder, not elsewhere classified  Muscle weakness (generalized)  Cramp and spasm  Abnormal posture  Rationale for Evaluation and Treatment Rehabilitation  ONSET DATE: 07/07/2022  SUBJECTIVE:                                                                                                                                                                                      SUBJECTIVE STATEMENT: Patient reports he gets a lot of pain in his biceps, his son also  fell into him on Thanksgiving that spot.  Thinks part of it is that he hasn't used this arm since May, so has gotten very weak.   PERTINENT HISTORY: COPD, HTN, R shoulder RTC tear  PAIN:  Are you having pain? Yes: NPRS scale: 1-2/10 Pain location: L shoulder Pain description: dull ache  Aggravating factors: moving wrong Relieving factors: pain medication  PRECAUTIONS: Shoulder  WEIGHT BEARING RESTRICTIONS:  see post-surgical protocol  FALLS:  Has patient fallen in last 6 months?  Yes. Number of falls 1 off a ladder while trimming tall bush, ladder broke  LIVING ENVIRONMENT: Lives with: lives with their family Lives in: House/apartment Stairs: Yes: External: 2 steps; none Has following equipment at home: None  OCCUPATION: Retired, was working part time before accident  PLOF: Independent  PATIENT GOALS:get regular use of my arm back  OBJECTIVE:   DIAGNOSTIC FINDINGS:  07/07/2022 DG Left Shoulder IMPRESSION: Post left total shoulder replacement without evidence of complication.  PATIENT SURVEYS:  Quick Dash 54.5% disability   COGNITION: Overall cognitive status: Within functional limits for tasks assessed     SENSATION: Light touch: Impaired   slightly diminished in L hand  POSTURE: Rounded shoulders  UPPER EXTREMITY ROM:   Active ROM Right eval Left Eval NT* Left 08/03/22  Shoulder flexion 155  90  Shoulder extension 84    Shoulder abduction 160  88  Shoulder adduction     Shoulder internal rotation 60    Shoulder external rotation 80  19  (Blank rows = not tested) *NT due to post-surgical precautions  UPPER EXTREMITY MMT:  MMT Right eval Left Eval NT*  Shoulder flexion 5   Shoulder extension 5   Shoulder abduction 5   Shoulder adduction 5   Shoulder internal rotation 5   Shoulder external rotation 5   Elbow flexion 5   Elbow extension 5   Wrist flexion 5   Wrist extension 5   Wrist pronation 5   Wrist supination 5   Grip strength  good    (Blank rows = not tested) *NT due to post-surgical precautions.   SHOULDER SPECIAL TESTS: NA  JOINT MOBILITY TESTING:  NA  PALPATION:  Incision covered with hydrocolloid bandage, no signs of infection.     TODAY'S TREATMENT:                                                                                                                           DATE:  08/10/2022 Therapeutic Exercise: to improve strength and mobility.  Demo, verbal and tactile cues throughout for technique. Pulleys flexion x 3 min, scaption x 3 min  Isometric shoulder flexion, extension, and abduction - at wall, submax, 5 x 10 sec hold each. Table slides flexion x 10, abduction x 10 R Wrist pronation/supination x 10 with hammer R Supine Chest press 3 x 10  Supine AAROM flexion 3 x 10 with cane Supine punches (shoulder protraction) 2 x 10  Manual Therapy: to decrease muscle spasm and pain  Myofascial Release to R biceps   08/03/22 Therapeutic Exercise: to improve strength and mobility.  Demo, verbal and tactile cues throughout for technique.  Seated ball slides green yoga ball 2x10 flexion and abduction Supine shoulder flexion, scaption, ER all with wand 2x10 each PROM to L shoulder all directions per protocol Pulleys flexion x 2 min  07/20/22 TherEx: Seated shoulder squeezes 2 x 10  Seated shoulder rolls x 10 Seated wrist flexion and extension x 10 Seated  shoulder flexion and scaption table slides x 10 L UE passive Seated AA flexion with green yoga ball L UE passive x 10 Seated ER AAROM with wand x 10 Manual Therapy: PROM to L shoulder per protocol  06/24/2022- Self care: see patient education.    PATIENT EDUCATION: Education details: HEP update and review Person educated: Patient Education method: Explanation, Demonstration, Verbal cues, and Handouts Education comprehension: verbalized understanding and returned demonstration  HOME EXERCISE PROGRAM: Access Code: [email or web address] URL:  [email or web address] Date: 08/10/2022 Prepared by: Glenetta Hew  Exercises - Seated Scapular Retraction  - 3 x daily - 7 x weekly - 1 sets - 10 reps - 3-5 sec hold - Seated Shoulder External Rotation AAROM with Dowel  - 1 x daily - 7 x weekly - 2 sets - 10 reps - Seated Shoulder Flexion Towel Slide at Table Top  - 1 x daily - 7 x weekly - 2 sets - 10 reps - Seated Shoulder Abduction Towel Slide at Table Top  - 1 x daily - 7 x weekly - 2 sets - 10 reps - Supine Shoulder Flexion Extension AAROM with Dowel  - 1 x daily - 7 x weekly - 2 sets - 10 reps - Supine Shoulder Scaption with Dowel  - 1 x daily - 7 x weekly - 2 sets - 10 reps - Supine Shoulder Press AAROM in Abduction with Dowel  - 1 x daily - 7 x weekly - 3 sets - 10 reps - Supine Single Arm Shoulder Protraction  - 1 x daily - 7 x weekly - 3 sets - 10 reps - Isometric Shoulder Extension at Wall  - 2 x daily - 7 x weekly - 1 sets - 5 reps - 10 sec  hold - Isometric Shoulder Flexion at Wall  - 2 x daily - 7 x weekly - 1 sets - 5 reps - 10 sec  hold - Isometric Shoulder Abduction at Wall  - 1 x daily - 7 x weekly - 1 sets - 5 reps - 10 secsup hold  ASSESSMENT:  CLINICAL IMPRESSION: Trevor Daniels is making good progress, demonstrating 110 deg R shoulder flexion (AAROM in supine).  He continues to reports primarily biceps pain - decreased after MFR to R biceps, we did discuss dry needling to area, will consider next session.  He is very compliant with HEP, added shoulder isometrics today per protocol.  Due to copays and other Dr visits (recently diagnosed with COPD), we decreased to 1x/week until returns to orthopedist, as he is following precautions and compliant with HEP.  [place] continues to demonstrate potential for improvement and would benefit from continued skilled therapy to address impairments.       OBJECTIVE IMPAIRMENTS: decreased endurance, decreased ROM, decreased strength, hypomobility, increased edema,  increased fascial restrictions, impaired perceived functional ability, increased muscle spasms, impaired flexibility, impaired UE functional use, postural dysfunction, and pain.   ACTIVITY LIMITATIONS: carrying, lifting, bending, sleeping, transfers, toileting, dressing, self feeding, reach over head, and hygiene/grooming  PARTICIPATION LIMITATIONS: meal prep, cleaning, laundry, and driving  PERSONAL FACTORS: 1-2 comorbidities: COPD, HTN  are also affecting patient's functional outcome.   REHAB POTENTIAL: Good  CLINICAL DECISION MAKING: Stable/uncomplicated  EVALUATION COMPLEXITY: Low  GOALS: Goals reviewed with patient? Yes  SHORT TERM GOALS: Target date: 08/18/2022   Patient will be independent with initial HEP.  Baseline: given Goal status: MET 08/10/2022  2.  Patient will demonstrate PROM L shoulder of 140 deg flexion, 60 deg ER,  and abduction to 120 deg to progress to next stage of protocol.   Baseline: NT today due to post-surgical protocol.  Goal status: IN PROGRESS  3. Patient will report 75% improvement in L shoulder pain to improve QOL.  Baseline: 7/10 when moves wrong Goal status: MET 08/10/2022   LONG TERM GOALS: Target date:  09/29/2022  Patient will be independent with advanced/ongoing HEP to improve outcomes and carryover.  Baseline: needs progression  Goal status: IN PROGRESS  2.  Patient to improve left shoulder AROM to [facility] without pain provocation to allow for increased ease of ADLs.  Baseline: NA Goal status: IN PROGRESS  3.  Patient will demonstrate improved functional left UE strength as demonstrated by 4/5 to progress strengthening program. Baseline: NA Goal status: IN PROGRESS  4.   Patient will report 10% improvement on QuickDash  to demonstrate improved functional ability.  Baseline: 54.5% disability Goal status: IN PROGRESS  5.  Patient will feel confident with transition to gym based exercise program to continue strengthening of L shoulder     Baseline:  Goal status: IN PROGRESS    PLAN:  PT FREQUENCY: 1-2x/week prefers 1x/week due to copay  PT DURATION: 12 weeks  PLANNED INTERVENTIONS: Therapeutic exercises, Therapeutic activity, Neuromuscular re-education, Balance training, Gait training, Patient/Family education, Self Care, Joint mobilization, Dry Needling, Electrical stimulation, Spinal mobilization, Cryotherapy, Moist heat, Vasopneumatic device, Ultrasound, Ionotophoresis 42m/ml Dexamethasone, Manual therapy, and Re-evaluation  PLAN FOR NEXT SESSION: continue to progress per protocol.  Modalities PRN.    ERennie Natter PT, DPT  08/10/2022, 12:30 PM

## 2022-08-17 ENCOUNTER — Ambulatory Visit: Payer: Medicare HMO

## 2022-08-20 ENCOUNTER — Ambulatory Visit: Payer: Medicare HMO | Admitting: Physical Therapy

## 2022-08-24 ENCOUNTER — Ambulatory Visit: Payer: Medicare HMO | Admitting: Physical Therapy

## 2022-08-26 ENCOUNTER — Ambulatory Visit (INDEPENDENT_AMBULATORY_CARE_PROVIDER_SITE_OTHER): Payer: Medicare HMO | Admitting: Orthopaedic Surgery

## 2022-08-26 DIAGNOSIS — M12812 Other specific arthropathies, not elsewhere classified, left shoulder: Secondary | ICD-10-CM

## 2022-08-26 NOTE — Progress Notes (Signed)
Post Operative Evaluation    Procedure/Date of Surgery: Left reverse shoulder arthoplasty 10/24  Interval History:   Presents today 6 weeks status post the above procedure.  Overall his range of motion is dramatically improved.  His pain is improved.  He continues to work with physical therapy with improvements in active range of motion   PMH/PSH/Family History/Social History/Meds/Allergies:    Past Medical History:  Diagnosis Date   COPD (chronic obstructive pulmonary disease) (HCC)    Coronary artery disease    Calcifications seen on CT   Hypertension    Past Surgical History:  Procedure Laterality Date   EYE SURGERY     66 years old   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/07/2022   Procedure: LEFT REVERSE SHOULDER ARTHROPLASTY;  Surgeon: Huel Cote, MD;  Location: [facility];  Service: Orthopedics;  Laterality: Left;   TONSILLECTOMY     Social History   Socioeconomic History   Marital status: Single    Spouse name: Not on file   Number of children: Not on file   Years of education: Not on file   Highest education level: Not on file  Occupational History   Not on file  Tobacco Use   Smoking status: Never   Smokeless tobacco: Never  Vaping Use   Vaping Use: Never used  Substance and Sexual Activity   Alcohol use: Yes    Comment: per pt-maybe a beer occ   Drug use: Never   Sexual activity: Not on file  Other Topics Concern   Not on file  Social History Narrative   Not on file   Social Determinants of Health   Financial Resource Strain: Not on file  Food Insecurity: Not on file  Transportation Needs: Not on file  Physical Activity: Not on file  Stress: Not on file  Social Connections: Not on file   No family history on file. No Known Allergies Current Outpatient Medications  Medication Sig Dispense Refill   albuterol (VENTOLIN HFA) 108 (90 Base) MCG/ACT inhaler Inhale 2 puffs into the lungs 4 (four) times daily as  needed for shortness of breath or wheezing.     aspirin EC 325 MG tablet Take 1 tablet (325 mg total) by mouth daily. 30 tablet 0   BREZTRI AEROSPHERE 160-9-4.8 MCG/ACT AERO Inhale 2 puffs into the lungs in the morning and at bedtime.     ibuprofen (ADVIL) 200 MG tablet Take 400 mg by mouth 2 (two) times daily as needed (pain.).     ibuprofen (ADVIL) 800 MG tablet Take 800 mg by mouth every 8 (eight) hours as needed (pain.).     lisinopril (ZESTRIL) 40 MG tablet Take 20 mg by mouth in the morning.     oxyCODONE (OXY IR/ROXICODONE) 5 MG immediate release tablet Take 1 tablet (5 mg total) by mouth every 4 (four) hours as needed (severe pain). 20 tablet 0   Vitamin D, Ergocalciferol, (DRISDOL) 1.25 MG (50000 UNIT) CAPS capsule Take 50,000 Units by mouth every Thursday.     No current facility-administered medications for this visit.   No results found.  Review of Systems:   A ROS was performed including pertinent positives and negatives as documented in the HPI.   Musculoskeletal Exam:    There were no vitals taken for this visit.  Left incision is healed.  In the standing position he is able to forward elevate to 110 degrees.  External rotation at the side is to 30 degrees.  Internal rotation is to side.   Distal neurosensory exam is intact  Imaging:    3 views left shoulder: Status post reverse shoulder arthroplasty without evidence of complication  I personally reviewed and interpreted the radiographs.   Assessment:   6-week status post reverse shoulder arthroplasty doing well.  At this time he will continue to work on passive range of motion with forward elevation external rotation.  He will also continue to work on active range of motion as well as strengthening according to the reverse shoulder Plan :    -Return in 6 weeks for reassessment      I personally saw and evaluated the patient, and participated in the management and treatment plan.  Huel Cote,  MD Attending Physician, Orthopedic Surgery  This document was dictated using Dragon voice recognition software. A reasonable attempt at proof reading has been made to minimize errors.

## 2022-08-31 ENCOUNTER — Encounter: Payer: Self-pay | Admitting: Physical Therapy

## 2022-08-31 ENCOUNTER — Ambulatory Visit: Payer: Medicare HMO | Attending: Orthopaedic Surgery | Admitting: Physical Therapy

## 2022-08-31 DIAGNOSIS — M25512 Pain in left shoulder: Secondary | ICD-10-CM | POA: Diagnosis present

## 2022-08-31 DIAGNOSIS — M6281 Muscle weakness (generalized): Secondary | ICD-10-CM | POA: Insufficient documentation

## 2022-08-31 DIAGNOSIS — R252 Cramp and spasm: Secondary | ICD-10-CM | POA: Insufficient documentation

## 2022-08-31 DIAGNOSIS — M25612 Stiffness of left shoulder, not elsewhere classified: Secondary | ICD-10-CM | POA: Diagnosis present

## 2022-08-31 DIAGNOSIS — R293 Abnormal posture: Secondary | ICD-10-CM | POA: Insufficient documentation

## 2022-08-31 NOTE — Therapy (Signed)
OUTPATIENT PHYSICAL THERAPY SHOULDER TREATMENT   Patient Name: Trevor Daniels MRN: [id number] DOB:[birth year], 66 y.o., male Today's Date: 08/31/2022   PT End of Session - 08/31/22 1114     Visit Number 5    Number of Visits 20    Date for PT Re-Evaluation 09/29/22    Authorization Type [employer] MCR    Authorization Time Period 07/13/22- 09/13/22    Authorization - Number of Visits 16    Progress Note Due on Visit 10    PT Start Time 1108    PT Stop Time 1150    PT Time Calculation (min) 42 min    Activity Tolerance Patient tolerated treatment well    Behavior During Therapy WFL for tasks assessed/performed                Past Medical History:  Diagnosis Date   COPD (chronic obstructive pulmonary disease) ([place])    Coronary artery disease    Calcifications seen on CT   Hypertension    Past Surgical History:  Procedure Laterality Date   EYE SURGERY     66 years old   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/07/2022   Procedure: LEFT REVERSE SHOULDER ARTHROPLASTY;  Surgeon: Vanetta Mulders, MD;  Location: [place];  Service: Orthopedics;  Laterality: Left;   TONSILLECTOMY     Patient Active Problem List   Diagnosis Date Noted   Rotator cuff arthropathy of left shoulder 07/07/2022   Acute pain of left shoulder 03/19/2022    PCP: Candida Peeling, PA-C   REFERRING PROVIDER: Tonny Bollman, MD  REFERRING DIAG: L reverse total shoulder arthroscopy  THERAPY DIAG:  Acute pain of left shoulder  Stiffness of left shoulder, not elsewhere classified  Muscle weakness (generalized)  Cramp and spasm  Abnormal posture  Rationale for Evaluation and Treatment Rehabilitation  ONSET DATE: 07/07/2022  SUBJECTIVE:                                                                                                                                                                                      SUBJECTIVE STATEMENT: Patient reports he has had a rough couple of weeks, his  granddaughter shared a stomach bug with him, then he had a bad COPD flair/possible collapsed lung.   Shoulder is doing well, less pain.  Saw Dr. Sammuel Hines on Wednesday, reassured about soreness in Biceps.   Flying to [place] on Wednesday.   PERTINENT HISTORY: COPD, HTN, R shoulder RTC tear  PAIN:  Are you having pain? No just tightness   PRECAUTIONS: Shoulder  WEIGHT BEARING RESTRICTIONS:  see post-surgical protocol  FALLS:  Has patient fallen in last 6 months? Yes. Number of  falls 1 off a ladder while trimming tall bush, ladder broke  LIVING ENVIRONMENT: Lives with: lives with their family Lives in: House/apartment Stairs: Yes: External: 2 steps; none Has following equipment at home: None  OCCUPATION: Retired, was working part time before accident  PLOF: Independent  PATIENT GOALS:get regular use of my arm back  OBJECTIVE:   DIAGNOSTIC FINDINGS:  07/07/2022 DG Left Shoulder IMPRESSION: Post left total shoulder replacement without evidence of complication.  PATIENT SURVEYS:  Quick Dash 54.5% disability   COGNITION: Overall cognitive status: Within functional limits for tasks assessed     SENSATION: Light touch: Impaired   slightly diminished in L hand  POSTURE: Rounded shoulders  UPPER EXTREMITY ROM:   Active ROM Right eval Left Eval NT* Left 08/03/22 Left 08/31/22  Shoulder flexion 155  90 120P /80 A  Shoulder extension 84     Shoulder abduction 160  88 92 A(120 P scaption/92 AROM)  Shoulder adduction      Shoulder internal rotation 60   55 P  Shoulder external rotation 80  19 26 P  (Blank rows = not tested)  A- active, P passive    UPPER EXTREMITY MMT:  MMT Right eval Left Eval NT* Left 08/31/2022  Shoulder flexion 5  2+  Shoulder extension 5  3+  Shoulder abduction 5  4  Shoulder adduction 5    Shoulder internal rotation 5  4+  Shoulder external rotation 5  2+  Elbow flexion 5  5  Elbow extension 5  5  Wrist flexion 5    Wrist extension 5     Wrist pronation 5    Wrist supination 5    Grip strength  good    (Blank rows = not tested) *NT due to post-surgical precautions.   SHOULDER SPECIAL TESTS: NA  JOINT MOBILITY TESTING:  NA  PALPATION:  Incision covered with hydrocolloid bandage, no signs of infection.     TODAY'S TREATMENT:                                                                                                                           DATE: 08/31/2022 Therapeutic Exercise: to improve strength and mobility.  Demo, verbal and tactile cues throughout for technique. Pulleys flexion x 3 min, scaption x 3 min  Supine Shoulder Flexion Extension Full Range AROM   - 10 reps Supine Single Arm Shoulder Protraction  2- 10 reps Sidelying Shoulder Abduction Palm Forward  2 sets - 10 reps Sidelying Shoulder External Rotation   2 sets - 10 reps Scapular Retraction with Resistance 2 sets - 10 reps YTB - cues to not ER shoulders but focus on squeezing shoulder blades together.  Prone Shoulder Extension -2 sets - 10 reps - to neutral Sliding hand up slide for functional IR stretch x 10  Therapeutic Activity:  ROM, MMT, to assess progress towards goals.    08/10/2022 Therapeutic Exercise: to improve strength and mobility.  Demo, verbal and tactile cues  throughout for technique. Pulleys flexion x 3 min, scaption x 3 min  Isometric shoulder flexion, extension, and abduction - at wall, submax, 5 x 10 sec hold each. Table slides flexion x 10, abduction x 10 R Wrist pronation/supination x 10 with hammer R Supine Chest press 3 x 10  Supine AAROM flexion 3 x 10 with cane Supine punches (shoulder protraction) 2 x 10  Manual Therapy: to decrease muscle spasm and pain  Myofascial Release to R biceps   08/03/22 Therapeutic Exercise: to improve strength and mobility.  Demo, verbal and tactile cues throughout for technique.  Seated ball slides green yoga ball 2x10 flexion and abduction Supine shoulder flexion, scaption, ER all with  wand 2x10 each PROM to L shoulder all directions per protocol Pulleys flexion x 2 min  PATIENT EDUCATION: Education details: HEP update, issued YTB Person educated: Patient Education method: Explanation, Demonstration, Verbal cues, and Handouts Education comprehension: verbalized understanding and returned demonstration  HOME EXERCISE PROGRAM: Access Code: [email or web address] URL: [email or web address] Date: 08/31/2022 Prepared by: Glenetta Hew  Exercises Added   - Supine Shoulder Flexion Extension Full Range AROM  - 1 x daily - 7 x weekly - 2-3 sets - 10 reps - Supine Single Arm Shoulder Protraction  - 1 x daily - 7 x weekly - 2-3 sets - 10 reps - Sidelying Shoulder Abduction Palm Forward  - 1 x daily - 7 x weekly - 2-3 sets - 10 reps - Sidelying Shoulder External Rotation  - 1 x daily - 7 x weekly - 2-3 sets - 10 reps - Scapular Retraction with Resistance  - 1 x daily - 7 x weekly - 2-3 sets - 10 reps - Prone Shoulder Extension - Single Arm  - 1 x daily - 7 x weekly - 2-3 sets - 10 reps  ASSESSMENT:  CLINICAL IMPRESSION: Trevaris Pennella Starr continues to make good progress despite limited visits and illness, demonstrating improving flexion and scaption to 120 deg (passive) and 80 deg AROM.  Progressed exercises today per protocol, initiating very gentle IR with sliding hand up side to tolerance, and progressing to AROM in gravity minimized positions.   Oberon Hehir Tagle continues to demonstrate potential for improvement and would benefit from continued skilled therapy to address impairments.       OBJECTIVE IMPAIRMENTS: decreased endurance, decreased ROM, decreased strength, hypomobility, increased edema, increased fascial restrictions, impaired perceived functional ability, increased muscle spasms, impaired flexibility, impaired UE functional use, postural dysfunction, and pain.   ACTIVITY LIMITATIONS: carrying, lifting, bending, sleeping, transfers, toileting, dressing, self feeding,  reach over head, and hygiene/grooming  PARTICIPATION LIMITATIONS: meal prep, cleaning, laundry, and driving  PERSONAL FACTORS: 1-2 comorbidities: COPD, HTN  are also affecting patient's functional outcome.   REHAB POTENTIAL: Good  CLINICAL DECISION MAKING: Stable/uncomplicated  EVALUATION COMPLEXITY: Low  GOALS: Goals reviewed with patient? Yes  SHORT TERM GOALS: Target date: 08/18/2022   Patient will be independent with initial HEP.  Baseline: given Goal status: MET 08/10/2022  2.  Patient will demonstrate PROM L shoulder of 140 deg flexion, 60 deg ER, and abduction to 120 deg to progress to next stage of protocol.   Baseline: NT today due to post-surgical protocol.  Goal status: IN PROGRESS 08/31/22 progressing, see objective.   3. Patient will report 75% improvement in L shoulder pain to improve QOL.  Baseline: 7/10 when moves wrong Goal status: MET 08/10/2022   LONG TERM GOALS: Target date:  09/29/2022  Patient will be independent with advanced/ongoing HEP to improve  outcomes and carryover.  Baseline: needs progression  Goal status: IN PROGRESS  2.  Patient to improve left shoulder AROM to [facility] without pain provocation to allow for increased ease of ADLs.  Baseline: NA Goal status: IN PROGRESS  3.  Patient will demonstrate improved functional left UE strength as demonstrated by 4/5 to progress strengthening program. Baseline: NA Goal status: IN PROGRESS 08/31/22 see objective  4.   Patient will report 10% improvement on QuickDash  to demonstrate improved functional ability.  Baseline: 54.5% disability Goal status: IN PROGRESS  5.  Patient will feel confident with transition to gym based exercise program to continue strengthening of L shoulder    Baseline:  Goal status: IN PROGRESS    PLAN:  PT FREQUENCY: 1-2x/week prefers 1x/week due to copay  PT DURATION: 12 weeks  PLANNED INTERVENTIONS: Therapeutic exercises, Therapeutic activity, Neuromuscular  re-education, Balance training, Gait training, Patient/Family education, Self Care, Joint mobilization, Dry Needling, Electrical stimulation, Spinal mobilization, Cryotherapy, Moist heat, Vasopneumatic device, Ultrasound, Ionotophoresis 26m/ml Dexamethasone, Manual therapy, and Re-evaluation  PLAN FOR NEXT SESSION: continue to progress per protocol.  Modalities PRN.    ERennie Natter PT, DPT  08/31/2022, 12:09 PM

## 2022-09-03 ENCOUNTER — Encounter: Payer: Medicare HMO | Admitting: Physical Therapy

## 2022-09-10 ENCOUNTER — Encounter: Payer: Self-pay | Admitting: Physical Therapy

## 2022-09-10 ENCOUNTER — Ambulatory Visit: Payer: Medicare HMO | Admitting: Physical Therapy

## 2022-09-10 DIAGNOSIS — R252 Cramp and spasm: Secondary | ICD-10-CM

## 2022-09-10 DIAGNOSIS — R293 Abnormal posture: Secondary | ICD-10-CM

## 2022-09-10 DIAGNOSIS — M25512 Pain in left shoulder: Secondary | ICD-10-CM | POA: Diagnosis not present

## 2022-09-10 DIAGNOSIS — M25612 Stiffness of left shoulder, not elsewhere classified: Secondary | ICD-10-CM

## 2022-09-10 DIAGNOSIS — M6281 Muscle weakness (generalized): Secondary | ICD-10-CM

## 2022-09-10 NOTE — Therapy (Signed)
OUTPATIENT PHYSICAL THERAPY SHOULDER TREATMENT   Patient Name: Trevor Daniels MRN: [id number] DOB:Mar 29, 1956, 66 y.o., male Today's Date: 09/10/2022   PT End of Session - 09/10/22 1102     Visit Number 6    Number of Visits 20    Date for PT Re-Evaluation 09/29/22    Authorization Type [employer] MCR    Authorization Time Period 07/13/22- 09/13/22    Authorization - Visit Number 6    Authorization - Number of Visits 16    Progress Note Due on Visit 10    PT Start Time 1102    PT Stop Time 1144    PT Time Calculation (min) 42 min    Activity Tolerance Patient tolerated treatment well    Behavior During Therapy WFL for tasks assessed/performed                 Past Medical History:  Diagnosis Date   COPD (chronic obstructive pulmonary disease) ([place])    Coronary artery disease    Calcifications seen on CT   Hypertension    Past Surgical History:  Procedure Laterality Date   EYE SURGERY     66 years old   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/07/2022   Procedure: LEFT REVERSE SHOULDER ARTHROPLASTY;  Surgeon: Vanetta Mulders, MD;  Location: [place];  Service: Orthopedics;  Laterality: Left;   TONSILLECTOMY     Patient Active Problem List   Diagnosis Date Noted   Rotator cuff arthropathy of left shoulder 07/07/2022   Acute pain of left shoulder 03/19/2022    PCP: Candida Peeling, PA-C   REFERRING PROVIDER: Tonny Bollman, MD  REFERRING DIAG: L reverse total shoulder arthroscopy  THERAPY DIAG:  Acute pain of left shoulder  Stiffness of left shoulder, not elsewhere classified  Muscle weakness (generalized)  Cramp and spasm  Abnormal posture  Rationale for Evaluation and Treatment Rehabilitation  ONSET DATE: 07/07/2022  NEXT MD VISIT: 10/14/2022   SUBJECTIVE:                                                                                                                                                                                      SUBJECTIVE  STATEMENT: Pt reports he is noticing a lot of improvement in function since his surgery - now able to reach out in front to grab things. Sleeping is still an issue due to pain at times.  PERTINENT HISTORY: COPD, HTN, R shoulder RTC tear  PAIN:  Are you having pain? No just tightness   PRECAUTIONS: Shoulder  WEIGHT BEARING RESTRICTIONS:  see post-surgical protocol  FALLS:  Has patient fallen in last 6 months? Yes. Number of falls 1 off a  ladder while trimming tall bush, ladder broke  LIVING ENVIRONMENT: Lives with: lives with their family Lives in: House/apartment Stairs: Yes: External: 2 steps; none Has following equipment at home: None  OCCUPATION: Retired, was working part time before accident  PLOF: Independent  PATIENT GOALS:get regular use of my arm back   OBJECTIVE:   DIAGNOSTIC FINDINGS:  07/07/2022 DG Left Shoulder IMPRESSION: Post left total shoulder replacement without evidence of complication.  PATIENT SURVEYS:  Quick Dash 54.5% disability   COGNITION: Overall cognitive status: Within functional limits for tasks assessed     SENSATION: Light touch: Impaired   slightly diminished in L hand  POSTURE: Rounded shoulders  UPPER EXTREMITY ROM:   Active ROM Right eval Left Eval NT* Left 08/03/22 Left 08/31/22 Left 09/10/22  Shoulder flexion 155  90 120P /80 A 120  P/91 A  Shoulder extension 84      Shoulder abduction 160  88 92 A(120 P scaption/92 AROM)   Shoulder adduction       Shoulder internal rotation 60   55 P   Shoulder external rotation 80  19 26 P   (Blank rows = not tested)  A- active, P passive    UPPER EXTREMITY MMT:  MMT Right eval Left Eval NT* Left 08/31/22  Shoulder flexion 5  2+  Shoulder extension 5  3+  Shoulder abduction 5  4  Shoulder adduction 5    Shoulder internal rotation 5  4+  Shoulder external rotation 5  2+  Elbow flexion 5  5  Elbow extension 5  5  Wrist flexion 5    Wrist extension 5    Wrist pronation  5    Wrist supination 5    Grip strength  good    (Blank rows = not tested) *NT due to post-surgical precautions.   SHOULDER SPECIAL TESTS: NA  JOINT MOBILITY TESTING:  NA  PALPATION:  Incision covered with hydrocolloid bandage, no signs of infection.     TODAY'S TREATMENT:                                                                                                                           DATE:  09/10/22 THERAPEUTIC EXERCISE: to improve flexibility, strength and mobility.  Verbal and tactile cues throughout for technique. Pulleys - flexion and scaption x 3 min each L shoulder flexion AROM with long lever arm and treatment table at 30 incline from supine x 10 L shoulder scaption AROM with long lever arm and table at 30 incline from supine x 10 - pt noting increased discomfort L shoulder scaption AROM with bent elbow (hitch-hiker) at 30 incline from supine x 10 L serratus press at ~100 flexion with table at 30 incline from supine 1# x 10 L shoulder circles CW/CCW at ~100 flexion with table at 30 incline from supine 1# x 10 each direction Sidelying L shoulder ER AROM + scap retraction  2 x 10 Sidelying bent elbow L shoulder ABD  2 x 10 Prone L shoulder extension to neutral + scap retraction 2 x 10 Prone L shoulder row + scap retraction 2 x 10 L hand slide up along side for FIR x 10 L AAROM FIR with cane slide up back 2 x 10 Standing B RTB scap retraction + row to neutral 10 x 3", 2 sets   08/31/2022 Therapeutic Exercise: to improve strength and mobility.  Demo, verbal and tactile cues throughout for technique. Pulleys flexion x 3 min, scaption x 3 min  Supine Shoulder Flexion Extension Full Range AROM   - 10 reps Supine Single Arm Shoulder Protraction  2- 10 reps Sidelying Shoulder Abduction Palm Forward  2 sets - 10 reps Sidelying Shoulder External Rotation   2 sets - 10 reps Scapular Retraction with Resistance 2 sets - 10 reps YTB - cues to not ER shoulders but focus  on squeezing shoulder blades together.  Prone Shoulder Extension -2 sets - 10 reps - to neutral Sliding hand up slide for functional IR stretch x 10  Therapeutic Activity:  ROM, MMT, to assess progress towards goals.    08/10/2022 Therapeutic Exercise: to improve strength and mobility.  Demo, verbal and tactile cues throughout for technique. Pulleys flexion x 3 min, scaption x 3 min  Isometric shoulder flexion, extension, and abduction - at wall, submax, 5 x 10 sec hold each. Table slides flexion x 10, abduction x 10 R Wrist pronation/supination x 10 with hammer R Supine Chest press 3 x 10  Supine AAROM flexion 3 x 10 with cane Supine punches (shoulder protraction) 2 x 10  Manual Therapy: to decrease muscle spasm and pain  Myofascial Release to R biceps   PATIENT EDUCATION: Education details: HEP update, issued RTB Person educated: Patient Education method: Explanation, Demonstration, Verbal cues, and Handouts Education comprehension: verbalized understanding and returned demonstration  HOME EXERCISE PROGRAM: Access Code: [email or web address] URL: [email or web address] Date: 08/31/2022 Prepared by: Glenetta Hew  Exercises Added   - Supine Shoulder Flexion Extension Full Range AROM  - 1 x daily - 7 x weekly - 2-3 sets - 10 reps - Supine Single Arm Shoulder Protraction  - 1 x daily - 7 x weekly - 2-3 sets - 10 reps - Sidelying Shoulder Abduction Palm Forward  - 1 x daily - 7 x weekly - 2-3 sets - 10 reps - Sidelying Shoulder External Rotation  - 1 x daily - 7 x weekly - 2-3 sets - 10 reps - Scapular Retraction with Resistance  - 1 x daily - 7 x weekly - 2-3 sets - 10 reps - Prone Shoulder Extension - Single Arm  - 1 x daily - 7 x weekly - 2-3 sets - 10 reps  ASSESSMENT:  CLINICAL IMPRESSION: Adonte reports improving functional use of L arm with reaching forward but continues to struggle with reaching behind his back.  He is progressing as expected per postop rehab  protocol and was able to advance supine AROM to 30 degree incline today.  Cues necessary to increase scapular engagement with some exercises to avoid excessive shoulder extension and upward rotation of elbows during Thera-Band resisted rows. Damondre will continue to benefit from skilled PT to restore functional left UE ROM and strength to improve activity tolerance and functional use of left upper extremity.   OBJECTIVE IMPAIRMENTS: decreased endurance, decreased ROM, decreased strength, hypomobility, increased edema, increased fascial restrictions, impaired perceived functional ability, increased muscle spasms, impaired flexibility, impaired UE functional use, postural dysfunction, and pain.   ACTIVITY LIMITATIONS: carrying,  lifting, bending, sleeping, transfers, toileting, dressing, self feeding, reach over head, and hygiene/grooming  PARTICIPATION LIMITATIONS: meal prep, cleaning, laundry, and driving  PERSONAL FACTORS: 1-2 comorbidities: COPD, HTN  are also affecting patient's functional outcome.   REHAB POTENTIAL: Good  CLINICAL DECISION MAKING: Stable/uncomplicated  EVALUATION COMPLEXITY: Low  GOALS: Goals reviewed with patient? Yes  SHORT TERM GOALS: Target date: 08/18/2022   Patient will be independent with initial HEP.  Baseline: given Goal status: MET 08/10/2022  2.  Patient will demonstrate PROM L shoulder of 140 deg flexion, 60 deg ER, and abduction to 120 deg to progress to next stage of protocol.   Baseline: NT today due to post-surgical protocol.  Goal status: IN PROGRESS 08/31/22 progressing, see objective.   3. Patient will report 75% improvement in L shoulder pain to improve QOL.  Baseline: 7/10 when moves wrong Goal status: MET 08/10/2022   LONG TERM GOALS: Target date:  09/29/2022  Patient will be independent with advanced/ongoing HEP to improve outcomes and carryover.  Baseline: needs progression  Goal status: IN PROGRESS  2.  Patient to improve left shoulder  AROM to [facility] without pain provocation to allow for increased ease of ADLs.  Baseline: NA Goal status: IN PROGRESS  3.  Patient will demonstrate improved functional left UE strength as demonstrated by 4/5 to progress strengthening program. Baseline: NA Goal status: IN PROGRESS 08/31/22 see objective  4.   Patient will report 10% improvement on QuickDash  to demonstrate improved functional ability.  Baseline: 54.5% disability Goal status: IN PROGRESS  5.  Patient will feel confident with transition to gym based exercise program to continue strengthening of L shoulder    Baseline:  Goal status: IN PROGRESS    PLAN:  PT FREQUENCY: 1-2x/week prefers 1x/week due to copay  PT DURATION: 12 weeks  PLANNED INTERVENTIONS: Therapeutic exercises, Therapeutic activity, Neuromuscular re-education, Balance training, Gait training, Patient/Family education, Self Care, Joint mobilization, Dry Needling, Electrical stimulation, Spinal mobilization, Cryotherapy, Moist heat, Vasopneumatic device, Ultrasound, Ionotophoresis 70m/ml Dexamethasone, Manual therapy, and Re-evaluation  PLAN FOR NEXT SESSION: continue to progress per protocol.  Modalities PRN.    JPercival Spanish PT, DPT  09/10/2022, 11:53 AM

## 2022-09-16 NOTE — Therapy (Incomplete)
OUTPATIENT PHYSICAL THERAPY SHOULDER TREATMENT   Patient Name: Trevor Daniels MRN: [id number] DOB:05-Aug-1956, 67 y.o., male Today's Date: 09/10/2022   PT End of Session - 09/10/22 1102     Visit Number 6    Number of Visits 20    Date for PT Re-Evaluation 09/29/22    Authorization Type [employer] MCR    Authorization Time Period 07/13/22- 09/13/22    Authorization - Visit Number 6    Authorization - Number of Visits 16    Progress Note Due on Visit 10    PT Start Time 1102    PT Stop Time 1144    PT Time Calculation (min) 42 min    Activity Tolerance Patient tolerated treatment well    Behavior During Therapy WFL for tasks assessed/performed                 Past Medical History:  Diagnosis Date   COPD (chronic obstructive pulmonary disease) ([place])    Coronary artery disease    Calcifications seen on CT   Hypertension    Past Surgical History:  Procedure Laterality Date   EYE SURGERY     67 years old   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/07/2022   Procedure: LEFT REVERSE SHOULDER ARTHROPLASTY;  Surgeon: Vanetta Mulders, MD;  Location: [facility];  Service: Orthopedics;  Laterality: Left;   TONSILLECTOMY     Patient Active Problem List   Diagnosis Date Noted   Rotator cuff arthropathy of left shoulder 07/07/2022   Acute pain of left shoulder 03/19/2022    PCP: Candida Peeling, PA-C   REFERRING PROVIDER: Tonny Bollman, MD  REFERRING DIAG: L reverse total shoulder arthroscopy  THERAPY DIAG:  Acute pain of left shoulder  Stiffness of left shoulder, not elsewhere classified  Muscle weakness (generalized)  Cramp and spasm  Abnormal posture  Rationale for Evaluation and Treatment Rehabilitation  ONSET DATE: 07/07/2022  NEXT MD VISIT: 10/14/2022   SUBJECTIVE:                                                                                                                                                                                      SUBJECTIVE  STATEMENT: ***  PERTINENT HISTORY: COPD, HTN, R shoulder RTC tear  PAIN:  Are you having pain? No just tightness   PRECAUTIONS: Shoulder  WEIGHT BEARING RESTRICTIONS:  see post-surgical protocol  FALLS:  Has patient fallen in last 6 months? Yes. Number of falls 1 off a ladder while trimming tall bush, ladder broke  LIVING ENVIRONMENT: Lives with: lives with their family Lives in: House/apartment Stairs: Yes: External: 2 steps; none Has following equipment at home: None  OCCUPATION: Retired,  was working part time before accident  PLOF: Independent  PATIENT GOALS:get regular use of my arm back   OBJECTIVE:   DIAGNOSTIC FINDINGS:  07/07/2022 DG Left Shoulder IMPRESSION: Post left total shoulder replacement without evidence of complication.  PATIENT SURVEYS:  Quick Dash 54.5% disability   COGNITION: Overall cognitive status: Within functional limits for tasks assessed     SENSATION: Light touch: Impaired   slightly diminished in L hand  POSTURE: Rounded shoulders  UPPER EXTREMITY ROM:   Active ROM Right eval Left Eval NT* Left 08/03/22 Left 08/31/22 Left 09/10/22  Shoulder flexion 155  90 120P /80 A 120  P/91 A  Shoulder extension 84      Shoulder abduction 160  88 92 A(120 P scaption/92 AROM)   Shoulder adduction       Shoulder internal rotation 60   55 P   Shoulder external rotation 80  19 26 P   (Blank rows = not tested)  A- active, P passive    UPPER EXTREMITY MMT:  MMT Right eval Left Eval NT* Left 08/31/22  Shoulder flexion 5  2+  Shoulder extension 5  3+  Shoulder abduction 5  4  Shoulder adduction 5    Shoulder internal rotation 5  4+  Shoulder external rotation 5  2+  Elbow flexion 5  5  Elbow extension 5  5  Wrist flexion 5    Wrist extension 5    Wrist pronation 5    Wrist supination 5    Grip strength  good    (Blank rows = not tested) *NT due to post-surgical precautions.   SHOULDER SPECIAL TESTS: NA  JOINT MOBILITY  TESTING:  NA  PALPATION:  Incision covered with hydrocolloid bandage, no signs of infection.     TODAY'S TREATMENT:                                                                                                                           DATE:  09/17/22 THERAPEUTIC EXERCISE: to improve flexibility, strength and mobility.  Verbal and tactile cues throughout for technique. Pulleys - flexion and scaption x 3 min each  ***  09/10/22 THERAPEUTIC EXERCISE: to improve flexibility, strength and mobility.  Verbal and tactile cues throughout for technique. Pulleys - flexion and scaption x 3 min each L shoulder flexion AROM with long lever arm and treatment table at 30 incline from supine x 10 L shoulder scaption AROM with long lever arm and table at 30 incline from supine x 10 - pt noting increased discomfort L shoulder scaption AROM with bent elbow (hitch-hiker) at 30 incline from supine x 10 L serratus press at ~100 flexion with table at 30 incline from supine 1# x 10 L shoulder circles CW/CCW at ~100 flexion with table at 30 incline from supine 1# x 10 each direction Sidelying L shoulder ER AROM + scap retraction  2 x 10 Sidelying bent elbow L shoulder ABD 2 x 10 Prone L  shoulder extension to neutral + scap retraction 2 x 10 Prone L shoulder row + scap retraction 2 x 10 L hand slide up along side for FIR x 10 L AAROM FIR with cane slide up back 2 x 10 Standing B RTB scap retraction + row to neutral 10 x 3", 2 sets   08/31/2022 Therapeutic Exercise: to improve strength and mobility.  Demo, verbal and tactile cues throughout for technique. Pulleys flexion x 3 min, scaption x 3 min  Supine Shoulder Flexion Extension Full Range AROM   - 10 reps Supine Single Arm Shoulder Protraction  2- 10 reps Sidelying Shoulder Abduction Palm Forward  2 sets - 10 reps Sidelying Shoulder External Rotation   2 sets - 10 reps Scapular Retraction with Resistance 2 sets - 10 reps YTB - cues to not ER  shoulders but focus on squeezing shoulder blades together.  Prone Shoulder Extension -2 sets - 10 reps - to neutral Sliding hand up slide for functional IR stretch x 10  Therapeutic Activity:  ROM, MMT, to assess progress towards goals.    PATIENT EDUCATION: Education details: HEP update, issued RTB Person educated: Patient Education method: Explanation, Demonstration, Verbal cues, and Handouts Education comprehension: verbalized understanding and returned demonstration  HOME EXERCISE PROGRAM: Access Code: [email or web address] URL: [email or web address] Date: 08/31/2022 Prepared by: Glenetta Hew  Exercises Added   - Supine Shoulder Flexion Extension Full Range AROM  - 1 x daily - 7 x weekly - 2-3 sets - 10 reps - Supine Single Arm Shoulder Protraction  - 1 x daily - 7 x weekly - 2-3 sets - 10 reps - Sidelying Shoulder Abduction Palm Forward  - 1 x daily - 7 x weekly - 2-3 sets - 10 reps - Sidelying Shoulder External Rotation  - 1 x daily - 7 x weekly - 2-3 sets - 10 reps - Scapular Retraction with Resistance  - 1 x daily - 7 x weekly - 2-3 sets - 10 reps - Prone Shoulder Extension - Single Arm  - 1 x daily - 7 x weekly - 2-3 sets - 10 reps  ASSESSMENT:  CLINICAL IMPRESSION: Ricahrd reports improving functional use of L arm with reaching forward but continues to struggle with reaching behind his back.  He is progressing as expected per postop rehab protocol and was able to advance supine AROM to 30 degree incline today.  Cues necessary to increase scapular engagement with some exercises to avoid excessive shoulder extension and upward rotation of elbows during Thera-Band resisted rows. Kayen will continue to benefit from skilled PT to restore functional left UE ROM and strength to improve activity tolerance and functional use of left upper extremity.   OBJECTIVE IMPAIRMENTS: decreased endurance, decreased ROM, decreased strength, hypomobility, increased edema, increased fascial  restrictions, impaired perceived functional ability, increased muscle spasms, impaired flexibility, impaired UE functional use, postural dysfunction, and pain.   ACTIVITY LIMITATIONS: carrying, lifting, bending, sleeping, transfers, toileting, dressing, self feeding, reach over head, and hygiene/grooming  PARTICIPATION LIMITATIONS: meal prep, cleaning, laundry, and driving  PERSONAL FACTORS: 1-2 comorbidities: COPD, HTN  are also affecting patient's functional outcome.   REHAB POTENTIAL: Good  CLINICAL DECISION MAKING: Stable/uncomplicated  EVALUATION COMPLEXITY: Low  GOALS: Goals reviewed with patient? Yes  SHORT TERM GOALS: Target date: 08/18/2022   Patient will be independent with initial HEP.  Baseline: given Goal status: MET 08/10/2022  2.  Patient will demonstrate PROM L shoulder of 140 deg flexion, 60 deg ER, and abduction to 120 deg to progress  to next stage of protocol.   Baseline: NT today due to post-surgical protocol.  Goal status: IN PROGRESS 08/31/22 progressing, see objective.   3. Patient will report 75% improvement in L shoulder pain to improve QOL.  Baseline: 7/10 when moves wrong Goal status: MET 08/10/2022   LONG TERM GOALS: Target date:  09/29/2022  Patient will be independent with advanced/ongoing HEP to improve outcomes and carryover.  Baseline: needs progression  Goal status: IN PROGRESS  2.  Patient to improve left shoulder AROM to [facility] without pain provocation to allow for increased ease of ADLs.  Baseline: NA Goal status: IN PROGRESS  3.  Patient will demonstrate improved functional left UE strength as demonstrated by 4/5 to progress strengthening program. Baseline: NA Goal status: IN PROGRESS 08/31/22 see objective  4.   Patient will report 10% improvement on QuickDash  to demonstrate improved functional ability.  Baseline: 54.5% disability Goal status: IN PROGRESS  5.  Patient will feel confident with transition to gym based exercise program  to continue strengthening of L shoulder    Baseline:  Goal status: IN PROGRESS    PLAN:  PT FREQUENCY: 1-2x/week prefers 1x/week due to copay  PT DURATION: 12 weeks  PLANNED INTERVENTIONS: Therapeutic exercises, Therapeutic activity, Neuromuscular re-education, Balance training, Gait training, Patient/Family education, Self Care, Joint mobilization, Dry Needling, Electrical stimulation, Spinal mobilization, Cryotherapy, Moist heat, Vasopneumatic device, Ultrasound, Ionotophoresis 61m/ml Dexamethasone, Manual therapy, and Re-evaluation  PLAN FOR NEXT SESSION: continue to progress per protocol.  Modalities PRN.    JPercival Spanish PT 09/10/2022, 11:53 AM

## 2022-09-17 ENCOUNTER — Ambulatory Visit: Payer: Medicare HMO | Admitting: Physical Therapy

## 2022-09-21 ENCOUNTER — Encounter: Payer: Medicare HMO | Admitting: Physical Therapy

## 2022-09-24 ENCOUNTER — Encounter: Payer: Self-pay | Admitting: Physical Therapy

## 2022-09-24 ENCOUNTER — Ambulatory Visit: Payer: Medicare HMO | Attending: Orthopaedic Surgery | Admitting: Physical Therapy

## 2022-09-24 DIAGNOSIS — R252 Cramp and spasm: Secondary | ICD-10-CM | POA: Insufficient documentation

## 2022-09-24 DIAGNOSIS — M6281 Muscle weakness (generalized): Secondary | ICD-10-CM | POA: Insufficient documentation

## 2022-09-24 DIAGNOSIS — M25512 Pain in left shoulder: Secondary | ICD-10-CM | POA: Insufficient documentation

## 2022-09-24 DIAGNOSIS — R293 Abnormal posture: Secondary | ICD-10-CM | POA: Insufficient documentation

## 2022-09-24 DIAGNOSIS — M25612 Stiffness of left shoulder, not elsewhere classified: Secondary | ICD-10-CM | POA: Insufficient documentation

## 2022-09-24 NOTE — Therapy (Signed)
OUTPATIENT PHYSICAL THERAPY SHOULDER TREATMENT   Patient Name: Trevor Daniels MRN: [id number] DOB:1956/06/22, 67 y.o., male Today's Date: 09/24/2022   PT End of Session - 09/24/22 0918     Visit Number 7    Number of Visits 20    Date for PT Re-Evaluation 09/29/22    Authorization Type [employer] MCR    Authorization Time Period 07/13/22- 09/13/22    Authorization - Number of Visits 16    Progress Note Due on Visit 10    PT Start Time 0924    PT Stop Time 1015    PT Time Calculation (min) 51 min    Activity Tolerance Patient tolerated treatment well    Behavior During Therapy WFL for tasks assessed/performed                 Past Medical History:  Diagnosis Date   COPD (chronic obstructive pulmonary disease) ([place])    Coronary artery disease    Calcifications seen on CT   Hypertension    Past Surgical History:  Procedure Laterality Date   EYE SURGERY     67 years old   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/07/2022   Procedure: LEFT REVERSE SHOULDER ARTHROPLASTY;  Surgeon: Vanetta Mulders, MD;  Location: [place];  Service: Orthopedics;  Laterality: Left;   TONSILLECTOMY     Patient Active Problem List   Diagnosis Date Noted   Rotator cuff arthropathy of left shoulder 07/07/2022   Acute pain of left shoulder 03/19/2022    PCP: Candida Peeling, PA-C   REFERRING PROVIDER: Tonny Bollman, MD  REFERRING DIAG: L reverse total shoulder arthroscopy  THERAPY DIAG:  Acute pain of left shoulder  Stiffness of left shoulder, not elsewhere classified  Muscle weakness (generalized)  Cramp and spasm  Abnormal posture  Rationale for Evaluation and Treatment Rehabilitation  ONSET DATE: 07/07/2022  NEXT MD VISIT: 10/14/2022   SUBJECTIVE:                                                                                                                                                                                      SUBJECTIVE STATEMENT: Patient reports having pain in  shoulder mostly only in morning when wakes up, because sleeping on right side.  Some pain near elbow, also thinks part of it because doing so much more and muscles are atrophied.  Starting to be able to reach behind back to pull pants up.   PERTINENT HISTORY: COPD, HTN, R shoulder RTC tear  PAIN:  Are you having pain? No just tightness   PRECAUTIONS: Shoulder  WEIGHT BEARING RESTRICTIONS:  see post-surgical protocol  FALLS:  Has patient fallen in last 6  months? Yes. Number of falls 1 off a ladder while trimming tall bush, ladder broke  LIVING ENVIRONMENT: Lives with: lives with their family Lives in: House/apartment Stairs: Yes: External: 2 steps; none Has following equipment at home: None  OCCUPATION: Retired, was working part time before accident  PLOF: Independent  PATIENT GOALS:get regular use of my arm back   OBJECTIVE:   DIAGNOSTIC FINDINGS:  07/07/2022 DG Left Shoulder IMPRESSION: Post left total shoulder replacement without evidence of complication.  PATIENT SURVEYS:  Quick Dash 54.5% disability   COGNITION: Overall cognitive status: Within functional limits for tasks assessed     SENSATION: Light touch: Impaired   slightly diminished in L hand  POSTURE: Rounded shoulders  UPPER EXTREMITY ROM:   Active ROM Right eval Left Eval NT* Left 08/03/22 Left 08/31/22 Left 09/10/22  Shoulder flexion 155  90 120P /80 A 120  P/91 A  Shoulder extension 84      Shoulder abduction 160  88 92 A(120 P scaption/92 AROM)   Shoulder adduction       Shoulder internal rotation 60   55 P   Shoulder external rotation 80  19 26 P   (Blank rows = not tested)  A- active, P passive    UPPER EXTREMITY MMT:  MMT Right eval Left Eval NT* Left 08/31/22  Shoulder flexion 5  2+  Shoulder extension 5  3+  Shoulder abduction 5  4  Shoulder adduction 5    Shoulder internal rotation 5  4+  Shoulder external rotation 5  2+  Elbow flexion 5  5  Elbow extension 5  5   Wrist flexion 5    Wrist extension 5    Wrist pronation 5    Wrist supination 5    Grip strength  good    (Blank rows = not tested) *NT due to post-surgical precautions.   SHOULDER SPECIAL TESTS: NA  JOINT MOBILITY TESTING:  NA  PALPATION:  Incision covered with hydrocolloid bandage, no signs of infection.     TODAY'S TREATMENT:                                                                                                                           DATE: 09/24/2022 Therapeutic Exercise: to improve strength and mobility.  Demo, verbal and tactile cues throughout for technique. Pulleys flexion and scaption x 3 min each  L shoulder flexion AROM with long lever arm and treatment table at 30 incline from supine x 10 L serratus press at ~100 flexion with table at 30 incline from supine 1# x 10 L shoulder circles CW/CCW at ~100 flexion with table at 30 incline from supine 1# x 10 each direction - increased biceps pain Wall slides flexion x 5 (prior to modalities/manual) x 10 after Shelf touches - second shelf x 10 Manual Therapy: to decrease muscle spasm and pain and improve mobility IASTM with s/s tools to L biceps, STM/TPR and MFR to L biceps, gentle PROM  L shoulder flexion.  Modalities: [place] x 8 min to L biceps ( , 1.2 w/cm2 continuous) to decrease pain and inflammation.    09/10/22 THERAPEUTIC EXERCISE: to improve flexibility, strength and mobility.  Verbal and tactile cues throughout for technique. Pulleys - flexion and scaption x 3 min each L shoulder flexion AROM with long lever arm and treatment table at 30 incline from supine x 10 L shoulder scaption AROM with long lever arm and table at 30 incline from supine x 10 - pt noting increased discomfort L shoulder scaption AROM with bent elbow (hitch-hiker) at 30 incline from supine x 10 L serratus press at ~100 flexion with table at 30 incline from supine 1# x 10 L shoulder circles CW/CCW at ~100 flexion with table at  30 incline from supine 1# x 10 each direction Sidelying L shoulder ER AROM + scap retraction  2 x 10 Sidelying bent elbow L shoulder ABD 2 x 10 Prone L shoulder extension to neutral + scap retraction 2 x 10 Prone L shoulder row + scap retraction 2 x 10 L hand slide up along side for FIR x 10 L AAROM FIR with cane slide up back 2 x 10 Standing B RTB scap retraction + row to neutral 10 x 3", 2 sets   08/31/2022 Therapeutic Exercise: to improve strength and mobility.  Demo, verbal and tactile cues throughout for technique. Pulleys flexion x 3 min, scaption x 3 min  Supine Shoulder Flexion Extension Full Range AROM   - 10 reps Supine Single Arm Shoulder Protraction  2- 10 reps Sidelying Shoulder Abduction Palm Forward  2 sets - 10 reps Sidelying Shoulder External Rotation   2 sets - 10 reps Scapular Retraction with Resistance 2 sets - 10 reps YTB - cues to not ER shoulders but focus on squeezing shoulder blades together.  Prone Shoulder Extension -2 sets - 10 reps - to neutral Sliding hand up slide for functional IR stretch x 10  Therapeutic Activity:  ROM, MMT, to assess progress towards goals.    08/10/2022 Therapeutic Exercise: to improve strength and mobility.  Demo, verbal and tactile cues throughout for technique. Pulleys flexion x 3 min, scaption x 3 min  Isometric shoulder flexion, extension, and abduction - at wall, submax, 5 x 10 sec hold each. Table slides flexion x 10, abduction x 10 R Wrist pronation/supination x 10 with hammer R Supine Chest press 3 x 10  Supine AAROM flexion 3 x 10 with cane Supine punches (shoulder protraction) 2 x 10  Manual Therapy: to decrease muscle spasm and pain  Myofascial Release to R biceps   PATIENT EDUCATION: Education details: HEP update, issued RTB Person educated: Patient Education method: Explanation, Demonstration, Verbal cues, and Handouts Education comprehension: verbalized understanding and returned demonstration  HOME EXERCISE  PROGRAM: Access Code: [email or web address] URL: [email or web address] Date: 08/31/2022 Prepared by: Harrie Foreman  Exercises Added   - Supine Shoulder Flexion Extension Full Range AROM  - 1 x daily - 7 x weekly - 2-3 sets - 10 reps - Supine Single Arm Shoulder Protraction  - 1 x daily - 7 x weekly - 2-3 sets - 10 reps - Sidelying Shoulder Abduction Palm Forward  - 1 x daily - 7 x weekly - 2-3 sets - 10 reps - Sidelying Shoulder External Rotation  - 1 x daily - 7 x weekly - 2-3 sets - 10 reps - Scapular Retraction with Resistance  - 1 x daily - 7 x weekly - 2-3 sets - 10 reps -  Prone Shoulder Extension - Single Arm  - 1 x daily - 7 x weekly - 2-3 sets - 10 reps  ASSESSMENT:  CLINICAL IMPRESSION: Neno reporting limitation/pain with exercise today due to L biceps pain, noted large tender point in L biceps, after manual therapy and [place], reported significant decrease in pain and was able to perform exercises without biceps pain, feeling exercises more in shoulder.  Added wall slides to HEP today both for strengthening and gentle stretching as very tight with flexion when arrived today.  At beginning of session only 108 deg L shoulder flexion, at end 118 deg shoulder flexion.  Niles will continue to benefit from skilled PT to restore functional left UE ROM and strength to improve activity tolerance and functional use of left upper extremity.   OBJECTIVE IMPAIRMENTS: decreased endurance, decreased ROM, decreased strength, hypomobility, increased edema, increased fascial restrictions, impaired perceived functional ability, increased muscle spasms, impaired flexibility, impaired UE functional use, postural dysfunction, and pain.   ACTIVITY LIMITATIONS: carrying, lifting, bending, sleeping, transfers, toileting, dressing, self feeding, reach over head, and hygiene/grooming  PARTICIPATION LIMITATIONS: meal prep, cleaning, laundry, and driving  PERSONAL FACTORS: 1-2 comorbidities: COPD, HTN  are  also affecting patient's functional outcome.   REHAB POTENTIAL: Good  CLINICAL DECISION MAKING: Stable/uncomplicated  EVALUATION COMPLEXITY: Low  GOALS: Goals reviewed with patient? Yes  SHORT TERM GOALS: Target date: 08/18/2022   Patient will be independent with initial HEP.  Baseline: given Goal status: MET 08/10/2022  2.  Patient will demonstrate PROM L shoulder of 140 deg flexion, 60 deg ER, and abduction to 120 deg to progress to next stage of protocol.   Baseline: NT today due to post-surgical protocol.  Goal status: IN PROGRESS 08/31/22 progressing, see objective.   3. Patient will report 75% improvement in L shoulder pain to improve QOL.  Baseline: 7/10 when moves wrong Goal status: MET 08/10/2022   LONG TERM GOALS: Target date:  09/29/2022  Patient will be independent with advanced/ongoing HEP to improve outcomes and carryover.  Baseline: needs progression  Goal status: IN PROGRESS  2.  Patient to improve left shoulder AROM to [facility] without pain provocation to allow for increased ease of ADLs.  Baseline: NA Goal status: IN PROGRESS  3.  Patient will demonstrate improved functional left UE strength as demonstrated by 4/5 to progress strengthening program. Baseline: NA Goal status: IN PROGRESS 08/31/22 see objective  4.   Patient will report 10% improvement on QuickDash  to demonstrate improved functional ability.  Baseline: 54.5% disability Goal status: IN PROGRESS  5.  Patient will feel confident with transition to gym based exercise program to continue strengthening of L shoulder    Baseline:  Goal status: IN PROGRESS    PLAN:  PT FREQUENCY: 1-2x/week prefers 1x/week due to copay  PT DURATION: 12 weeks  PLANNED INTERVENTIONS: Therapeutic exercises, Therapeutic activity, Neuromuscular re-education, Balance training, Gait training, Patient/Family education, Self Care, Joint mobilization, Dry Needling, Electrical stimulation, Spinal mobilization, Cryotherapy,  Moist heat, Vasopneumatic device, Ultrasound, Ionotophoresis 4mg /ml Dexamethasone, Manual therapy, and Re-evaluation  PLAN FOR NEXT SESSION: continue to progress per protocol.  Modalities PRN.    , PT, DPT  09/24/2022, 10:21 AM

## 2022-09-28 ENCOUNTER — Ambulatory Visit: Payer: Medicare HMO | Admitting: Physical Therapy

## 2022-09-28 ENCOUNTER — Encounter: Payer: Self-pay | Admitting: Physical Therapy

## 2022-09-28 DIAGNOSIS — M25512 Pain in left shoulder: Secondary | ICD-10-CM

## 2022-09-28 DIAGNOSIS — M25612 Stiffness of left shoulder, not elsewhere classified: Secondary | ICD-10-CM

## 2022-09-28 DIAGNOSIS — R252 Cramp and spasm: Secondary | ICD-10-CM

## 2022-09-28 DIAGNOSIS — M6281 Muscle weakness (generalized): Secondary | ICD-10-CM

## 2022-09-28 DIAGNOSIS — R293 Abnormal posture: Secondary | ICD-10-CM

## 2022-09-28 NOTE — Therapy (Signed)
OUTPATIENT PHYSICAL THERAPY SHOULDER TREATMENT   Patient Name: Trevor Daniels MRN: [id number] DOB:1956/01/30, 67 y.o., male Today's Date: 09/28/2022   PT End of Session - 09/28/22 0936     Visit Number 8    Number of Visits 20    Date for PT Re-Evaluation 09/29/22    Authorization Type [employer] MCR    Authorization Time Period 07/13/22- 09/13/22    Authorization - Number of Visits 16    Progress Note Due on Visit 10    PT Start Time 0933    PT Stop Time 1015    PT Time Calculation (min) 42 min    Activity Tolerance Patient tolerated treatment well    Behavior During Therapy WFL for tasks assessed/performed                 Past Medical History:  Diagnosis Date   COPD (chronic obstructive pulmonary disease) ([place])    Coronary artery disease    Calcifications seen on CT   Hypertension    Past Surgical History:  Procedure Laterality Date   EYE SURGERY     66 years old   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/07/2022   Procedure: LEFT REVERSE SHOULDER ARTHROPLASTY;  Surgeon: Vanetta Mulders, MD;  Location: [place];  Service: Orthopedics;  Laterality: Left;   TONSILLECTOMY     Patient Active Problem List   Diagnosis Date Noted   Rotator cuff arthropathy of left shoulder 07/07/2022   Acute pain of left shoulder 03/19/2022    PCP: Candida Peeling, PA-C   REFERRING PROVIDER: Tonny Bollman, MD  REFERRING DIAG: L reverse total shoulder arthroscopy  THERAPY DIAG:  Acute pain of left shoulder  Stiffness of left shoulder, not elsewhere classified  Muscle weakness (generalized)  Cramp and spasm  Abnormal posture  Rationale for Evaluation and Treatment Rehabilitation  ONSET DATE: 07/07/2022  NEXT MD VISIT: 10/14/2022   SUBJECTIVE:                                                                                                                                                                                      SUBJECTIVE STATEMENT: Still reports mostly pain first  thing in morning when waking up, thinks its how he's sleeping.  Biceps feels better since Thursday.   PERTINENT HISTORY: COPD, HTN, R shoulder RTC tear  PAIN:  Are you having pain? No just tightness   PRECAUTIONS: Shoulder  WEIGHT BEARING RESTRICTIONS:  see post-surgical protocol  FALLS:  Has patient fallen in last 6 months? Yes. Number of falls 1 off a ladder while trimming tall bush, ladder broke  LIVING ENVIRONMENT: Lives with: lives with their family Lives in: House/apartment Stairs:  Yes: External: 2 steps; none Has following equipment at home: None  OCCUPATION: Retired, was working part time before accident  PLOF: Independent  PATIENT GOALS:get regular use of my arm back   OBJECTIVE:   DIAGNOSTIC FINDINGS:  07/07/2022 DG Left Shoulder IMPRESSION: Post left total shoulder replacement without evidence of complication.  PATIENT SURVEYS:  Quick Dash 54.5% disability   COGNITION: Overall cognitive status: Within functional limits for tasks assessed     SENSATION: Light touch: Impaired   slightly diminished in L hand  POSTURE: Rounded shoulders  UPPER EXTREMITY ROM:   Active ROM Right eval Left Eval NT* Left 08/03/22 Left 08/31/22 Left 09/10/22  Shoulder flexion 155  90 120P /80 A 120  P/91 A  Shoulder extension 84      Shoulder abduction 160  88 92 A(120 P scaption/92 AROM)   Shoulder adduction       Shoulder internal rotation 60   55 P   Shoulder external rotation 80  19 26 P   (Blank rows = not tested)  A- active, P passive    UPPER EXTREMITY MMT:  MMT Right eval Left Eval NT* Left 08/31/22  Shoulder flexion 5  2+  Shoulder extension 5  3+  Shoulder abduction 5  4  Shoulder adduction 5    Shoulder internal rotation 5  4+  Shoulder external rotation 5  2+  Elbow flexion 5  5  Elbow extension 5  5  Wrist flexion 5    Wrist extension 5    Wrist pronation 5    Wrist supination 5    Grip strength  good    (Blank rows = not tested) *NT  due to post-surgical precautions.   SHOULDER SPECIAL TESTS: NA  JOINT MOBILITY TESTING:  NA  PALPATION:  Incision covered with hydrocolloid bandage, no signs of infection.     TODAY'S TREATMENT:                                                                                                                           DATE: 09/28/2022 Therapeutic Exercise: to improve strength and mobility.  Demo, verbal and tactile cues throughout for technique. Pulleys flexion and scaption x 3 min each  L shoulder flexion AROM with long lever arm and treatment table at 30 incline from supine x 10 L shoulder scaption AROM with long lever arm and table at 30 incline from supine x 10  L shoulder scaption AROM with bent elbow (hitch-hiker) at 30 incline from supine x 10 L serratus press at ~100 flexion with table at 30 incline from supine 1# x 20 L shoulder circles CW/CCW at ~100 flexion with table at 30 incline from supine 1# x 10 each direction Sidelying L shoulder ER AROM  2 x 10 Sidelying bent elbow L shoulder ABD 2 x 10 L hand slide up along side for FIR x 10 Review counter touches and wall slides  Modalities: [place] x 8 min to L biceps (  1MHz, 1.2 w/cm2 continuous) to decrease pain and inflammation.    09/24/2022 Therapeutic Exercise: to improve strength and mobility.  Demo, verbal and tactile cues throughout for technique. Pulleys flexion and scaption x 3 min each  L shoulder flexion AROM with long lever arm and treatment table at 30 incline from supine x 10 L serratus press at ~100 flexion with table at 30 incline from supine 1# x 10 L shoulder circles CW/CCW at ~100 flexion with table at 30 incline from supine 1# x 10 each direction - increased biceps pain Wall slides flexion x 5 (prior to modalities/manual) x 10 after Shelf touches - second shelf x 10 Manual Therapy: to decrease muscle spasm and pain and improve mobility IASTM with s/s tools to L biceps, STM/TPR and MFR to L biceps, gentle  PROM L shoulder flexion.  Modalities: [place] x 8 min to L biceps (1MHz, 1.2 w/cm2 continuous) to decrease pain and inflammation.    09/10/22 THERAPEUTIC EXERCISE: to improve flexibility, strength and mobility.  Verbal and tactile cues throughout for technique. Pulleys - flexion and scaption x 3 min each L shoulder flexion AROM with long lever arm and treatment table at 30 incline from supine x 10 L shoulder scaption AROM with long lever arm and table at 30 incline from supine x 10 - pt noting increased discomfort L shoulder scaption AROM with bent elbow (hitch-hiker) at 30 incline from supine x 10 L serratus press at ~100 flexion with table at 30 incline from supine 1# x 10 L shoulder circles CW/CCW at ~100 flexion with table at 30 incline from supine 1# x 10 each direction Sidelying L shoulder ER AROM + scap retraction  2 x 10 Sidelying bent elbow L shoulder ABD 2 x 10 Prone L shoulder extension to neutral + scap retraction 2 x 10 Prone L shoulder row + scap retraction 2 x 10 L hand slide up along side for FIR x 10 L AAROM FIR with cane slide up back 2 x 10 Standing B RTB scap retraction + row to neutral 10 x 3", 2 sets   08/31/2022 Therapeutic Exercise: to improve strength and mobility.  Demo, verbal and tactile cues throughout for technique. Pulleys flexion x 3 min, scaption x 3 min  Supine Shoulder Flexion Extension Full Range AROM   - 10 reps Supine Single Arm Shoulder Protraction  2- 10 reps Sidelying Shoulder Abduction Palm Forward  2 sets - 10 reps Sidelying Shoulder External Rotation   2 sets - 10 reps Scapular Retraction with Resistance 2 sets - 10 reps YTB - cues to not ER shoulders but focus on squeezing shoulder blades together.  Prone Shoulder Extension -2 sets - 10 reps - to neutral Sliding hand up slide for functional IR stretch x 10  Therapeutic Activity:  ROM, MMT, to assess progress towards goals.    PATIENT EDUCATION: Education details: HEP review  Person  educated: Patient Education method: Consulting civil engineer, Demonstration, Verbal cues, and Handouts Education comprehension: verbalized understanding and returned demonstration  HOME EXERCISE PROGRAM: Access Code: [email or web address] URL: [email or web address] Date: 08/31/2022 Prepared by: Glenetta Hew  ASSESSMENT:  CLINICAL IMPRESSION: Jadien reports significant improvement in L biceps pain with activity following last treatment session, but still reporting some discomfort with some of the exercises.  Cues for keeping arm straight to isolate with shoulder protraction.  Applied [place] again to decrease inflammation in biceps.  Eder Macek Joshi continues to demonstrate potential for improvement and would benefit from continued skilled therapy to address impairments.  OBJECTIVE IMPAIRMENTS: decreased endurance, decreased ROM, decreased strength, hypomobility, increased edema, increased fascial restrictions, impaired perceived functional ability, increased muscle spasms, impaired flexibility, impaired UE functional use, postural dysfunction, and pain.   ACTIVITY LIMITATIONS: carrying, lifting, bending, sleeping, transfers, toileting, dressing, self feeding, reach over head, and hygiene/grooming  PARTICIPATION LIMITATIONS: meal prep, cleaning, laundry, and driving  PERSONAL FACTORS: 1-2 comorbidities: COPD, HTN  are also affecting patient's functional outcome.   REHAB POTENTIAL: Good  CLINICAL DECISION MAKING: Stable/uncomplicated  EVALUATION COMPLEXITY: Low  GOALS: Goals reviewed with patient? Yes  SHORT TERM GOALS: Target date: 08/18/2022   Patient will be independent with initial HEP.  Baseline: given Goal status: MET 08/10/2022  2.  Patient will demonstrate PROM L shoulder of 140 deg flexion, 60 deg ER, and abduction to 120 deg to progress to next stage of protocol.   Baseline: NT today due to post-surgical protocol.  Goal status: IN PROGRESS 08/31/22 progressing, see objective.   3.  Patient will report 75% improvement in L shoulder pain to improve QOL.  Baseline: 7/10 when moves wrong Goal status: MET 08/10/2022   LONG TERM GOALS: Target date:  09/29/2022  Patient will be independent with advanced/ongoing HEP to improve outcomes and carryover.  Baseline: needs progression  Goal status: IN PROGRESS  2.  Patient to improve left shoulder AROM to [facility] without pain provocation to allow for increased ease of ADLs.  Baseline: NA Goal status: IN PROGRESS  3.  Patient will demonstrate improved functional left UE strength as demonstrated by 4/5 to progress strengthening program. Baseline: NA Goal status: IN PROGRESS 08/31/22 see objective  4.   Patient will report 10% improvement on QuickDash  to demonstrate improved functional ability.  Baseline: 54.5% disability Goal status: IN PROGRESS  5.  Patient will feel confident with transition to gym based exercise program to continue strengthening of L shoulder    Baseline:  Goal status: IN PROGRESS    PLAN:  PT FREQUENCY: 1-2x/week prefers 1x/week due to copay  PT DURATION: 12 weeks  PLANNED INTERVENTIONS: Therapeutic exercises, Therapeutic activity, Neuromuscular re-education, Balance training, Gait training, Patient/Family education, Self Care, Joint mobilization, Dry Needling, Electrical stimulation, Spinal mobilization, Cryotherapy, Moist heat, Vasopneumatic device, Ultrasound, Ionotophoresis 4mg /ml Dexamethasone, Manual therapy, and Re-evaluation  PLAN FOR NEXT SESSION: continue to progress per protocol.  Modalities PRN.   Week 12 starting 09/29/2022.    10/01/2022, PT, DPT  09/28/2022, 10:27 AM

## 2022-10-01 ENCOUNTER — Ambulatory Visit: Payer: Medicare HMO

## 2022-10-01 DIAGNOSIS — R252 Cramp and spasm: Secondary | ICD-10-CM

## 2022-10-01 DIAGNOSIS — M25512 Pain in left shoulder: Secondary | ICD-10-CM | POA: Diagnosis not present

## 2022-10-01 DIAGNOSIS — R293 Abnormal posture: Secondary | ICD-10-CM

## 2022-10-01 DIAGNOSIS — M25612 Stiffness of left shoulder, not elsewhere classified: Secondary | ICD-10-CM

## 2022-10-01 DIAGNOSIS — M6281 Muscle weakness (generalized): Secondary | ICD-10-CM

## 2022-10-01 NOTE — Therapy (Signed)
OUTPATIENT PHYSICAL THERAPY SHOULDER TREATMENT Progress Note Reporting Period 07/14/23 to 09/29/22  See note below for Objective Data and Assessment of Progress/Goals.   Trevor Daniels is still lacking expected L shoulder flexion and abduction and would benefit from continued skilled therapy to maximize functional improvement.  Recommend continuing 1-2x/week for additional 6 weeks.     Patient Name: Trevor Daniels MRN: [id number] DOB:[birth year], 67 y.o., male Today's Date: 10/01/2022   PT End of Session - 10/01/22 1025     Visit Number 9    Number of Visits 20    Date for PT Re-Evaluation 11/12/22    Authorization Type [employer] MCR    Authorization - Number of Visits 16    Progress Note Due on Visit 10    PT Start Time 0926    PT Stop Time 1016    PT Time Calculation (min) 50 min    Activity Tolerance Patient tolerated treatment well    Behavior During Therapy WFL for tasks assessed/performed                  Past Medical History:  Diagnosis Date   COPD (chronic obstructive pulmonary disease) ([place])    Coronary artery disease    Calcifications seen on CT   Hypertension    Past Surgical History:  Procedure Laterality Date   EYE SURGERY     67 years old   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/07/2022   Procedure: LEFT REVERSE SHOULDER ARTHROPLASTY;  Surgeon: Vanetta Mulders, MD;  Location: [place];  Service: Orthopedics;  Laterality: Left;   TONSILLECTOMY     Patient Active Problem List   Diagnosis Date Noted   Rotator cuff arthropathy of left shoulder 07/07/2022   Acute pain of left shoulder 03/19/2022    PCP: Candida Peeling, PA-C   REFERRING PROVIDER: Tonny Bollman, MD  REFERRING DIAG: L reverse total shoulder arthroscopy  THERAPY DIAG:  Acute pain of left shoulder  Stiffness of left shoulder, not elsewhere classified  Muscle weakness (generalized)  Cramp and spasm  Abnormal posture  Rationale for Evaluation and Treatment Rehabilitation  ONSET DATE:  07/07/2022  NEXT MD VISIT: 10/14/2022   SUBJECTIVE:                                                                                                                                                                                      SUBJECTIVE STATEMENT: Pt still has tightness in his shoulder mostly in the back where infraspinatus muscle attaches.   PERTINENT HISTORY: COPD, HTN, R shoulder RTC tear  PAIN:  Are you having pain? No just tightness   PRECAUTIONS: Shoulder  WEIGHT BEARING RESTRICTIONS:  see  post-surgical protocol  FALLS:  Has patient fallen in last 6 months? Yes. Number of falls 1 off a ladder while trimming tall bush, ladder broke  LIVING ENVIRONMENT: Lives with: lives with their family Lives in: House/apartment Stairs: Yes: External: 2 steps; none Has following equipment at home: None  OCCUPATION: Retired, was working part time before accident  PLOF: Independent  PATIENT GOALS:get regular use of my arm back   OBJECTIVE:   DIAGNOSTIC FINDINGS:  07/07/2022 DG Left Shoulder IMPRESSION: Post left total shoulder replacement without evidence of complication.  PATIENT SURVEYS:  Quick Dash 54.5% disability   COGNITION: Overall cognitive status: Within functional limits for tasks assessed     SENSATION: Light touch: Impaired   slightly diminished in L hand  POSTURE: Rounded shoulders  UPPER EXTREMITY ROM:   Active ROM Right eval Left Eval NT* Left 08/03/22 Left 08/31/22 Left 09/10/22 Left 10/01/22 AROM  Shoulder flexion 155  90 120P /80 A 120  P/91 A 110   Shoulder extension 84       Shoulder abduction 160  88 92 A(120 P scaption/92 AROM)  113  Shoulder adduction        Shoulder internal rotation 60   55 P  43  Shoulder external rotation 80  19 26 P  32  (Blank rows = not tested)  A- active, P passive    UPPER EXTREMITY MMT:  MMT Right eval Left Eval NT* Left 08/31/22 Left 10/01/22  Shoulder flexion 5  2+ 3+/5  Shoulder extension 5   3+   Shoulder abduction 5  4 4   Shoulder adduction 5     Shoulder internal rotation 5  4+ 4+  Shoulder external rotation 5  2+ 3  Elbow flexion 5  5   Elbow extension 5  5   Wrist flexion 5     Wrist extension 5     Wrist pronation 5     Wrist supination 5     Grip strength  good     (Blank rows = not tested) *NT due to post-surgical precautions.   SHOULDER SPECIAL TESTS: NA  JOINT MOBILITY TESTING:  NA  PALPATION:  Incision covered with hydrocolloid bandage, no signs of infection.     TODAY'S TREATMENT:                                                                                                                           DATE:  10/01/22 Therapeutic Exercise: to improve strength and mobility.  Demo, verbal and tactile cues throughout for technique. Pulleys flexion and scaption x 3 min each  L shoulder AROM flexion (short lever) x 10  L shoulder AAROM with cane hitchhiker and uppercut x 10 Manual Therapy: to decrease muscle spasm and pain and improve mobility  STM to L biceps and anterior deltoid PROM to L shoulder in all directions Modalities: [place] x 8 min to L biceps (1MHz, 1.2 w/cm2 continuous) to decrease pain and inflammation.  Therapeutic  Activity:  ROM, MMT to assess progress towards goals.   09/28/2022 Therapeutic Exercise: to improve strength and mobility.  Demo, verbal and tactile cues throughout for technique. Pulleys flexion and scaption x 3 min each  L shoulder flexion AROM with long lever arm and treatment table at 30 incline from supine x 10 L shoulder scaption AROM with long lever arm and table at 30 incline from supine x 10  L shoulder scaption AROM with bent elbow (hitch-hiker) at 30 incline from supine x 10 L serratus press at ~100 flexion with table at 30 incline from supine 1# x 20 L shoulder circles CW/CCW at ~100 flexion with table at 30 incline from supine 1# x 10 each direction Sidelying L shoulder ER AROM  2 x 10 Sidelying bent elbow L  shoulder ABD 2 x 10 L hand slide up along side for FIR x 10 Review counter touches and wall slides  Modalities: [place] x 8 min to L biceps (1MHz, 1.2 w/cm2 continuous) to decrease pain and inflammation.    09/24/2022 Therapeutic Exercise: to improve strength and mobility.  Demo, verbal and tactile cues throughout for technique. Pulleys flexion and scaption x 3 min each  L shoulder flexion AROM with long lever arm and treatment table at 30 incline from supine x 10 L serratus press at ~100 flexion with table at 30 incline from supine 1# x 10 L shoulder circles CW/CCW at ~100 flexion with table at 30 incline from supine 1# x 10 each direction - increased biceps pain Wall slides flexion x 5 (prior to modalities/manual) x 10 after Shelf touches - second shelf x 10 Manual Therapy: to decrease muscle spasm and pain and improve mobility IASTM with s/s tools to L biceps, STM/TPR and MFR to L biceps, gentle PROM L shoulder flexion.  Modalities: [place] x 8 min to L biceps (1MHz, 1.2 w/cm2 continuous) to decrease pain and inflammation.      PATIENT EDUCATION: Education details: HEP review  Person educated: Patient Education method: Consulting civil engineer, Demonstration, Verbal cues, and Handouts Education comprehension: verbalized understanding and returned demonstration  HOME EXERCISE PROGRAM: Access Code: [email or web address] URL: [email or web address] Date: 08/31/2022 Prepared by: Glenetta Hew  ASSESSMENT:  CLINICAL IMPRESSION: Jaquail demonstrates improved AROM and strength of L shoulder, however needs more work on both. He remains limited by tightness in his bicep area and posterior shoulder with overhead reaching. Per protocol patient is expected to be at least 135 deg of elevation which he is ~25 deg shy.  He would continue to benefit from skilled PT.  OBJECTIVE IMPAIRMENTS: decreased endurance, decreased ROM, decreased strength, hypomobility, increased edema, increased fascial restrictions,  impaired perceived functional ability, increased muscle spasms, impaired flexibility, impaired UE functional use, postural dysfunction, and pain.   ACTIVITY LIMITATIONS: carrying, lifting, bending, sleeping, transfers, toileting, dressing, self feeding, reach over head, and hygiene/grooming  PARTICIPATION LIMITATIONS: meal prep, cleaning, laundry, and driving  PERSONAL FACTORS: 1-2 comorbidities: COPD, HTN  are also affecting patient's functional outcome.   REHAB POTENTIAL: Good  CLINICAL DECISION MAKING: Stable/uncomplicated  EVALUATION COMPLEXITY: Low  GOALS: Goals reviewed with patient? Yes  SHORT TERM GOALS: Target date: 08/18/2022   Patient will be independent with initial HEP.  Baseline: given Goal status: MET 08/10/2022  2.  Patient will demonstrate PROM L shoulder of 140 deg flexion, 60 deg ER, and abduction to 120 deg to progress to next stage of protocol.   Baseline: NT today due to post-surgical protocol.  Goal status: IN PROGRESS 08/31/22 progressing, see objective.  3. Patient will report 75% improvement in L shoulder pain to improve QOL.  Baseline: 7/10 when moves wrong Goal status: MET 08/10/2022   LONG TERM GOALS: Target date:  09/29/2022 extended to 11/12/22 Patient will be independent with advanced/ongoing HEP to improve outcomes and carryover.  Baseline: needs progression  Goal status: IN PROGRESS  2.  Patient to improve left shoulder AROM to [facility] without pain provocation to allow for increased ease of ADLs.  Baseline: NA Goal status: IN PROGRESS - 10/01/22  3.  Patient will demonstrate improved functional left UE strength as demonstrated by 4/5 to progress strengthening program. Baseline: NA Goal status: IN PROGRESS 10/01/22 see objective  4.   Patient will report 10% improvement on QuickDash  to demonstrate improved functional ability.  Baseline: 54.5% disability Goal status: IN PROGRESS  5.  Patient will feel confident with transition to gym based  exercise program to continue strengthening of L shoulder    Baseline:  Goal status: IN PROGRESS    PLAN:  PT FREQUENCY: 1-2x/week prefers 1x/week due to copay  PT DURATION: 12 weeks  PLANNED INTERVENTIONS: Therapeutic exercises, Therapeutic activity, Neuromuscular re-education, Balance training, Gait training, Patient/Family education, Self Care, Joint mobilization, Dry Needling, Electrical stimulation, Spinal mobilization, Cryotherapy, Moist heat, Vasopneumatic device, Ultrasound, Ionotophoresis 4mg /ml Dexamethasone, Manual therapy, and Re-evaluation  PLAN FOR NEXT SESSION: continue to progress per protocol.  Modalities PRN.   Week 12 starting 09/29/2022.    Rennie Natter, PT, DPT  10/01/2022, 6:10 PM   Artist Pais, PTA 10/01/2022, 10:26 AM

## 2022-10-01 NOTE — Addendum Note (Signed)
Addended by: Rennie Natter on: 10/01/2022 06:12 PM   Modules accepted: Orders

## 2022-10-01 NOTE — Therapy (Signed)
OUTPATIENT PHYSICAL THERAPY SHOULDER TREATMENT Progress Note Reporting Period 07/14/23 to 09/29/22  See note below for Objective Data and Assessment of Progress/Goals.   Trevor Daniels is still lacking expected L shoulder flexion and abduction and would benefit from continued skilled therapy to maximize functional improvement.  Recommend continuing 1-2x/week for additional 6 weeks.     Patient Name: Trevor Daniels MRN: [id number] DOB:[birth year], 67 y.o., male Today's Date: 10/01/2022   PT End of Session - 10/01/22 1025     Visit Number 9    Number of Visits 20    Date for PT Re-Evaluation 11/12/22    Authorization Type [employer] MCR    Authorization - Number of Visits 16    Progress Note Due on Visit 10    PT Start Time 0926    PT Stop Time 1016    PT Time Calculation (min) 50 min    Activity Tolerance Patient tolerated treatment well    Behavior During Therapy WFL for tasks assessed/performed                  Past Medical History:  Diagnosis Date   COPD (chronic obstructive pulmonary disease) ([place])    Coronary artery disease    Calcifications seen on CT   Hypertension    Past Surgical History:  Procedure Laterality Date   EYE SURGERY     67 years old   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/07/2022   Procedure: LEFT REVERSE SHOULDER ARTHROPLASTY;  Surgeon: Vanetta Mulders, MD;  Location: [place];  Service: Orthopedics;  Laterality: Left;   TONSILLECTOMY     Patient Active Problem List   Diagnosis Date Noted   Rotator cuff arthropathy of left shoulder 07/07/2022   Acute pain of left shoulder 03/19/2022    PCP: Candida Peeling, PA-C   REFERRING PROVIDER: Tonny Bollman, MD  REFERRING DIAG: L reverse total shoulder arthroscopy  THERAPY DIAG:  Acute pain of left shoulder  Stiffness of left shoulder, not elsewhere classified  Muscle weakness (generalized)  Cramp and spasm  Abnormal posture  Rationale for Evaluation and Treatment Rehabilitation  ONSET DATE:  07/07/2022  NEXT MD VISIT: 10/14/2022   SUBJECTIVE:                                                                                                                                                                                      SUBJECTIVE STATEMENT: Pt still has tightness in his shoulder mostly in the back where infraspinatus muscle attaches.   PERTINENT HISTORY: COPD, HTN, R shoulder RTC tear  PAIN:  Are you having pain? No just tightness   PRECAUTIONS: Shoulder  WEIGHT BEARING RESTRICTIONS:  see  post-surgical protocol  FALLS:  Has patient fallen in last 6 months? Yes. Number of falls 1 off a ladder while trimming tall bush, ladder broke  LIVING ENVIRONMENT: Lives with: lives with their family Lives in: House/apartment Stairs: Yes: External: 2 steps; none Has following equipment at home: None  OCCUPATION: Retired, was working part time before accident  PLOF: Independent  PATIENT GOALS:get regular use of my arm back   OBJECTIVE:   DIAGNOSTIC FINDINGS:  07/07/2022 DG Left Shoulder IMPRESSION: Post left total shoulder replacement without evidence of complication.  PATIENT SURVEYS:  Quick Dash 54.5% disability   COGNITION: Overall cognitive status: Within functional limits for tasks assessed     SENSATION: Light touch: Impaired   slightly diminished in L hand  POSTURE: Rounded shoulders  UPPER EXTREMITY ROM:   Active ROM Right eval Left Eval NT* Left 08/03/22 Left 08/31/22 Left 09/10/22 Left 10/01/22 AROM  Shoulder flexion 155  90 120P /80 A 120  P/91 A 110   Shoulder extension 84       Shoulder abduction 160  88 92 A(120 P scaption/92 AROM)  113  Shoulder adduction        Shoulder internal rotation 60   55 P  43  Shoulder external rotation 80  19 26 P  32  (Blank rows = not tested)  A- active, P passive    UPPER EXTREMITY MMT:  MMT Right eval Left Eval NT* Left 08/31/22 Left 10/01/22  Shoulder flexion 5  2+ 3+/5  Shoulder extension 5   3+   Shoulder abduction 5  4 4   Shoulder adduction 5     Shoulder internal rotation 5  4+ 4+  Shoulder external rotation 5  2+ 3  Elbow flexion 5  5   Elbow extension 5  5   Wrist flexion 5     Wrist extension 5     Wrist pronation 5     Wrist supination 5     Grip strength  good     (Blank rows = not tested) *NT due to post-surgical precautions.   SHOULDER SPECIAL TESTS: NA  JOINT MOBILITY TESTING:  NA  PALPATION:  Incision covered with hydrocolloid bandage, no signs of infection.     TODAY'S TREATMENT:                                                                                                                           DATE:  10/01/22 Therapeutic Exercise: to improve strength and mobility.  Demo, verbal and tactile cues throughout for technique. Pulleys flexion and scaption x 3 min each  L shoulder AROM flexion (short lever) x 10  L shoulder AAROM with cane hitchhiker and uppercut x 10 Manual Therapy: to decrease muscle spasm and pain and improve mobility  STM to L biceps and anterior deltoid PROM to L shoulder in all directions Modalities: [place] x 8 min to L biceps (1MHz, 1.2 w/cm2 continuous) to decrease pain and inflammation.  Therapeutic  Activity:  ROM, MMT to assess progress towards goals.   09/28/2022 Therapeutic Exercise: to improve strength and mobility.  Demo, verbal and tactile cues throughout for technique. Pulleys flexion and scaption x 3 min each  L shoulder flexion AROM with long lever arm and treatment table at 30 incline from supine x 10 L shoulder scaption AROM with long lever arm and table at 30 incline from supine x 10  L shoulder scaption AROM with bent elbow (hitch-hiker) at 30 incline from supine x 10 L serratus press at ~100 flexion with table at 30 incline from supine 1# x 20 L shoulder circles CW/CCW at ~100 flexion with table at 30 incline from supine 1# x 10 each direction Sidelying L shoulder ER AROM  2 x 10 Sidelying bent elbow L  shoulder ABD 2 x 10 L hand slide up along side for FIR x 10 Review counter touches and wall slides  Modalities: [place] x 8 min to L biceps (1MHz, 1.2 w/cm2 continuous) to decrease pain and inflammation.    09/24/2022 Therapeutic Exercise: to improve strength and mobility.  Demo, verbal and tactile cues throughout for technique. Pulleys flexion and scaption x 3 min each  L shoulder flexion AROM with long lever arm and treatment table at 30 incline from supine x 10 L serratus press at ~100 flexion with table at 30 incline from supine 1# x 10 L shoulder circles CW/CCW at ~100 flexion with table at 30 incline from supine 1# x 10 each direction - increased biceps pain Wall slides flexion x 5 (prior to modalities/manual) x 10 after Shelf touches - second shelf x 10 Manual Therapy: to decrease muscle spasm and pain and improve mobility IASTM with s/s tools to L biceps, STM/TPR and MFR to L biceps, gentle PROM L shoulder flexion.  Modalities: [place] x 8 min to L biceps (1MHz, 1.2 w/cm2 continuous) to decrease pain and inflammation.      PATIENT EDUCATION: Education details: HEP review  Person educated: Patient Education method: Consulting civil engineer, Demonstration, Verbal cues, and Handouts Education comprehension: verbalized understanding and returned demonstration  HOME EXERCISE PROGRAM: Access Code: [email or web address] URL: [email or web address] Date: 08/31/2022 Prepared by: Glenetta Hew  ASSESSMENT:  CLINICAL IMPRESSION: Jaquail demonstrates improved AROM and strength of L shoulder, however needs more work on both. He remains limited by tightness in his bicep area and posterior shoulder with overhead reaching. Per protocol patient is expected to be at least 135 deg of elevation which he is ~25 deg shy.  He would continue to benefit from skilled PT.  OBJECTIVE IMPAIRMENTS: decreased endurance, decreased ROM, decreased strength, hypomobility, increased edema, increased fascial restrictions,  impaired perceived functional ability, increased muscle spasms, impaired flexibility, impaired UE functional use, postural dysfunction, and pain.   ACTIVITY LIMITATIONS: carrying, lifting, bending, sleeping, transfers, toileting, dressing, self feeding, reach over head, and hygiene/grooming  PARTICIPATION LIMITATIONS: meal prep, cleaning, laundry, and driving  PERSONAL FACTORS: 1-2 comorbidities: COPD, HTN  are also affecting patient's functional outcome.   REHAB POTENTIAL: Good  CLINICAL DECISION MAKING: Stable/uncomplicated  EVALUATION COMPLEXITY: Low  GOALS: Goals reviewed with patient? Yes  SHORT TERM GOALS: Target date: 08/18/2022   Patient will be independent with initial HEP.  Baseline: given Goal status: MET 08/10/2022  2.  Patient will demonstrate PROM L shoulder of 140 deg flexion, 60 deg ER, and abduction to 120 deg to progress to next stage of protocol.   Baseline: NT today due to post-surgical protocol.  Goal status: IN PROGRESS 08/31/22 progressing, see objective.  3. Patient will report 75% improvement in L shoulder pain to improve QOL.  Baseline: 7/10 when moves wrong Goal status: MET 08/10/2022   LONG TERM GOALS: Target date:  09/29/2022 extended to 11/12/22 Patient will be independent with advanced/ongoing HEP to improve outcomes and carryover.  Baseline: needs progression  Goal status: IN PROGRESS  2.  Patient to improve left shoulder AROM to [facility] without pain provocation to allow for increased ease of ADLs.  Baseline: NA Goal status: IN PROGRESS - 10/01/22  3.  Patient will demonstrate improved functional left UE strength as demonstrated by 4/5 to progress strengthening program. Baseline: NA Goal status: IN PROGRESS 10/01/22 see objective  4.   Patient will report 10% improvement on QuickDash  to demonstrate improved functional ability.  Baseline: 54.5% disability Goal status: IN PROGRESS  5.  Patient will feel confident with transition to gym based  exercise program to continue strengthening of L shoulder    Baseline:  Goal status: IN PROGRESS    PLAN:  PT FREQUENCY: 1-2x/week prefers 1x/week due to copay  PT DURATION: 12 weeks  PLANNED INTERVENTIONS: Therapeutic exercises, Therapeutic activity, Neuromuscular re-education, Balance training, Gait training, Patient/Family education, Self Care, Joint mobilization, Dry Needling, Electrical stimulation, Spinal mobilization, Cryotherapy, Moist heat, Vasopneumatic device, Ultrasound, Ionotophoresis 4mg /ml Dexamethasone, Manual therapy, and Re-evaluation  PLAN FOR NEXT SESSION: continue to progress per protocol.  Modalities PRN.   Week 12 starting 09/29/2022.    10/01/2022, PT, DPT  10/01/2022, 6:08 PM   10/03/2022, PTA 10/01/2022, 10:26 AM

## 2022-10-05 ENCOUNTER — Ambulatory Visit: Payer: Medicare HMO | Admitting: Physical Therapy

## 2022-10-08 ENCOUNTER — Ambulatory Visit: Payer: Medicare HMO

## 2022-10-08 DIAGNOSIS — M6281 Muscle weakness (generalized): Secondary | ICD-10-CM

## 2022-10-08 DIAGNOSIS — M25612 Stiffness of left shoulder, not elsewhere classified: Secondary | ICD-10-CM

## 2022-10-08 DIAGNOSIS — M25512 Pain in left shoulder: Secondary | ICD-10-CM

## 2022-10-08 DIAGNOSIS — R293 Abnormal posture: Secondary | ICD-10-CM

## 2022-10-08 DIAGNOSIS — R252 Cramp and spasm: Secondary | ICD-10-CM

## 2022-10-08 NOTE — Therapy (Addendum)
OUTPATIENT PHYSICAL THERAPY SHOULDER TREATMENT     Patient Name: Trevor Daniels MRN: [id number] DOB:08-12-1956, 67 y.o., male Today's Date: 10/08/2022   PT End of Session - 10/08/22 1020     Visit Number 10    Number of Visits 20    Date for PT Re-Evaluation 11/12/22    Authorization Type [employer] M[employer]    Authorization Time Period 1/18-2/29    Authorization - Visit Number 1    Authorization - Number of Visits 18    Progress Note Due on Visit 10    PT Start Time 0935    PT Stop Time 1016    PT Time Calculation (min) 41 min    Activity Tolerance Patient tolerated treatment well    Behavior During Therapy WFL for tasks assessed/performed                   Past Medical History:  Diagnosis Date   COPD (chronic obstructive pulmonary disease) ([place])    Coronary artery disease    Calcifications seen on CT   Hypertension    Past Surgical History:  Procedure Laterality Date   EYE SURGERY     67 years old   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/07/2022   Procedure: LEFT REVERSE SHOULDER ARTHROPLASTY;  Surgeon: Vanetta Mulders, MD;  Location: [place];  Service: Orthopedics;  Laterality: Left;   TONSILLECTOMY     Patient Active Problem List   Diagnosis Date Noted   Rotator cuff arthropathy of left shoulder 07/07/2022   Acute pain of left shoulder 03/19/2022    PCP: Candida Peeling, PA-C   REFERRING PROVIDER: Tonny Bollman, MD  REFERRING DIAG: L reverse total shoulder arthroscopy  THERAPY DIAG:  Acute pain of left shoulder  Stiffness of left shoulder, not elsewhere classified  Muscle weakness (generalized)  Cramp and spasm  Abnormal posture  Rationale for Evaluation and Treatment Rehabilitation  ONSET DATE: 07/07/2022  NEXT MD VISIT: 10/14/2022   SUBJECTIVE:                                                                                                                                                                                      SUBJECTIVE  STATEMENT: Pt still has tightness in his shoulder but feels better than it has.  PERTINENT HISTORY: COPD, HTN, R shoulder RTC tear  PAIN:  Are you having pain? No just tightness   PRECAUTIONS: Shoulder  WEIGHT BEARING RESTRICTIONS:  see post-surgical protocol  FALLS:  Has patient fallen in last 6 months? Yes. Number of falls 1 off a ladder while trimming tall bush, ladder broke  LIVING ENVIRONMENT: Lives with: lives with their family Lives in: House/apartment  Stairs: Yes: External: 2 steps; none Has following equipment at home: None  OCCUPATION: Retired, was working part time before accident  PLOF: Independent  PATIENT GOALS:get regular use of my arm back   OBJECTIVE:   DIAGNOSTIC FINDINGS:  07/07/2022 DG Left Shoulder IMPRESSION: Post left total shoulder replacement without evidence of complication.  PATIENT SURVEYS:  Quick Dash 54.5% disability   COGNITION: Overall cognitive status: Within functional limits for tasks assessed     SENSATION: Light touch: Impaired   slightly diminished in L hand  POSTURE: Rounded shoulders  UPPER EXTREMITY ROM:   Active ROM Right eval Left Eval NT* Left 08/03/22 Left 08/31/22 Left 09/10/22 Left 10/01/22 AROM  Shoulder flexion 155  90 120P /80 A 120  P/91 A 110   Shoulder extension 84       Shoulder abduction 160  88 92 A(120 P scaption/92 AROM)  113  Shoulder adduction        Shoulder internal rotation 60   55 P  43  Shoulder external rotation 80  19 26 P  32  (Blank rows = not tested)  A- active, P passive    UPPER EXTREMITY MMT:  MMT Right eval Left Eval NT* Left 08/31/22 Left 10/01/22  Shoulder flexion 5  2+ 3+/5  Shoulder extension 5  3+   Shoulder abduction '5  4 4  '$ Shoulder adduction 5     Shoulder internal rotation 5  4+ 4+  Shoulder external rotation 5  2+ 3  Elbow flexion 5  5   Elbow extension 5  5   Wrist flexion 5     Wrist extension 5     Wrist pronation 5     Wrist supination 5     Grip  strength  good     (Blank rows = not tested) *NT due to post-surgical precautions.   SHOULDER SPECIAL TESTS: NA  JOINT MOBILITY TESTING:  NA  PALPATION:  Incision covered with hydrocolloid bandage, no signs of infection.     TODAY'S TREATMENT:                                                                                                                           DATE:  10/08/22 Therapeutic Exercise: to improve strength and mobility.  Pulleys flexion and scaption x 3 min each  Supine L shoulder flexion 2x10; 1 set no weight, 2nd set with weight 1lb Supine L scapular protraction 1lb 2x10 S/L L shoulder abduction 2x10; 1 set no weight' 2nd set weight S/L L shoulder ER x 10 1lb Standing row GTB x 10  Standing shoulder extension GTB x 10 Standing ER RTB standing at doorfram x 10  Standing Horiz ABD RTB x 10   Manual Therapy: to decrease muscle spasm, pain and improve mobility.  STM to L infraspinatus, rhomboids, medial scapular border  10/01/22 Therapeutic Exercise: to improve strength and mobility.  Demo, verbal and tactile cues throughout for technique. Pulleys flexion and scaption x  3 min each  L shoulder AROM flexion (short lever) x 10  L shoulder AAROM with cane hitchhiker and uppercut x 10 Manual Therapy: to decrease muscle spasm and pain and improve mobility  STM to L biceps and anterior deltoid PROM to L shoulder in all directions Modalities: [place] x 8 min to L biceps (1MHz, 1.2 w/cm2 continuous) to decrease pain and inflammation.  Therapeutic Activity:  ROM, MMT to assess progress towards goals.   09/28/2022 Therapeutic Exercise: to improve strength and mobility.  Demo, verbal and tactile cues throughout for technique. Pulleys flexion and scaption x 3 min each  L shoulder flexion AROM with long lever arm and treatment table at 30 incline from supine x 10 L shoulder scaption AROM with long lever arm and table at 30 incline from supine x 10  L shoulder scaption AROM with  bent elbow (hitch-hiker) at 30 incline from supine x 10 L serratus press at ~100 flexion with table at 30 incline from supine 1# x 20 L shoulder circles CW/CCW at ~100 flexion with table at 30 incline from supine 1# x 10 each direction Sidelying L shoulder ER AROM  2 x 10 Sidelying bent elbow L shoulder ABD 2 x 10 L hand slide up along side for FIR x 10 Review counter touches and wall slides  Modalities: [place] x 8 min to L biceps (1MHz, 1.2 w/cm2 continuous) to decrease pain and inflammation.     PATIENT EDUCATION: Education details: HEP review  Person educated: Patient Education method: Consulting civil engineer, Demonstration, Verbal cues, and Handouts Education comprehension: verbalized understanding and returned demonstration  HOME EXERCISE PROGRAM: Access Code: [email or web address] URL: [email or web address] Date: 08/31/2022 Prepared by: Glenetta Hew  ASSESSMENT:  CLINICAL IMPRESSION: Pt w/o any complaints of increased pain this session. Advanced exercises to work on L shoulder mobility and strength, mainly focused on scapula to improve joint mechanics. He notes fatigue in his infraspinatus muscle with the scapular exercises, which led to STM to address muscle soreness. Able to progress rows and ext with GTB today.   OBJECTIVE IMPAIRMENTS: decreased endurance, decreased ROM, decreased strength, hypomobility, increased edema, increased fascial restrictions, impaired perceived functional ability, increased muscle spasms, impaired flexibility, impaired UE functional use, postural dysfunction, and pain.   ACTIVITY LIMITATIONS: carrying, lifting, bending, sleeping, transfers, toileting, dressing, self feeding, reach over head, and hygiene/grooming  PARTICIPATION LIMITATIONS: meal prep, cleaning, laundry, and driving  PERSONAL FACTORS: 1-2 comorbidities: COPD, HTN  are also affecting patient's functional outcome.   REHAB POTENTIAL: Good  CLINICAL DECISION MAKING:  Stable/uncomplicated  EVALUATION COMPLEXITY: Low  GOALS: Goals reviewed with patient? Yes  SHORT TERM GOALS: Target date: 08/18/2022   Patient will be independent with initial HEP.  Baseline: given Goal status: MET 08/10/2022  2.  Patient will demonstrate PROM L shoulder of 140 deg flexion, 60 deg ER, and abduction to 120 deg to progress to next stage of protocol.   Baseline: NT today due to post-surgical protocol.  Goal status: IN PROGRESS 08/31/22 progressing, see objective.   3. Patient will report 75% improvement in L shoulder pain to improve QOL.  Baseline: 7/10 when moves wrong Goal status: MET 08/10/2022   LONG TERM GOALS: Target date:  09/29/2022 extended to 11/12/22 Patient will be independent with advanced/ongoing HEP to improve outcomes and carryover.  Baseline: needs progression  Goal status: IN PROGRESS  2.  Patient to improve left shoulder AROM to [facility] without pain provocation to allow for increased ease of ADLs.  Baseline: NA Goal status: IN PROGRESS -  10/01/22  3.  Patient will demonstrate improved functional left UE strength as demonstrated by 4/5 to progress strengthening program. Baseline: NA Goal status: IN PROGRESS 10/01/22 see objective  4.   Patient will report 10% improvement on QuickDash  to demonstrate improved functional ability.  Baseline: 54.5% disability Goal status: IN PROGRESS  5.  Patient will feel confident with transition to gym based exercise program to continue strengthening of L shoulder    Baseline:  Goal status: IN PROGRESS    PLAN:  PT FREQUENCY: 1-2x/week prefers 1x/week due to copay  PT DURATION: 12 weeks  PLANNED INTERVENTIONS: Therapeutic exercises, Therapeutic activity, Neuromuscular re-education, Balance training, Gait training, Patient/Family education, Self Care, Joint mobilization, Dry Needling, Electrical stimulation, Spinal mobilization, Cryotherapy, Moist heat, Vasopneumatic device, Ultrasound, Ionotophoresis '4mg'$ /ml  Dexamethasone, Manual therapy, and Re-evaluation  PLAN FOR NEXT SESSION: continue to progress per protocol.  Modalities PRN.   Week 13 starting 10/06/2022.    Artist Pais, PTA 10/08/2022, 10:21 AM   PHYSICAL THERAPY DISCHARGE SUMMARY  Visits from Start of Care: 10  Current functional level related to goals / functional outcomes: See above   Remaining deficits: See above   Education / Equipment: HEP  Plan: Patient is being discharged due to not returning to therapy for 30+ days, last seen on 10/08/2022.    Rennie Natter, PT, DPT 11:41 AM 11/10/2022

## 2022-10-12 ENCOUNTER — Ambulatory Visit: Payer: Medicare HMO

## 2022-10-14 ENCOUNTER — Encounter (HOSPITAL_BASED_OUTPATIENT_CLINIC_OR_DEPARTMENT_OTHER): Payer: Medicare HMO | Admitting: Orthopaedic Surgery

## 2022-10-28 ENCOUNTER — Encounter (HOSPITAL_BASED_OUTPATIENT_CLINIC_OR_DEPARTMENT_OTHER): Payer: Medicare HMO | Admitting: Orthopaedic Surgery

## 2022-10-28 ENCOUNTER — Encounter (HOSPITAL_BASED_OUTPATIENT_CLINIC_OR_DEPARTMENT_OTHER): Payer: Self-pay

## 2022-12-28 ENCOUNTER — Encounter: Payer: Self-pay | Admitting: *Deleted

## 2023-05-20 IMAGING — CR DG HUMERUS 2V *L*
2 series · 2 of 2 positions shown · non-contrast
Comparison: None Available.

CLINICAL DATA: Fall on shoulder from 5 foot ladder yesterday.

EXAM:
LEFT HUMERUS - 2+ VIEW

[w humerus ap left *]
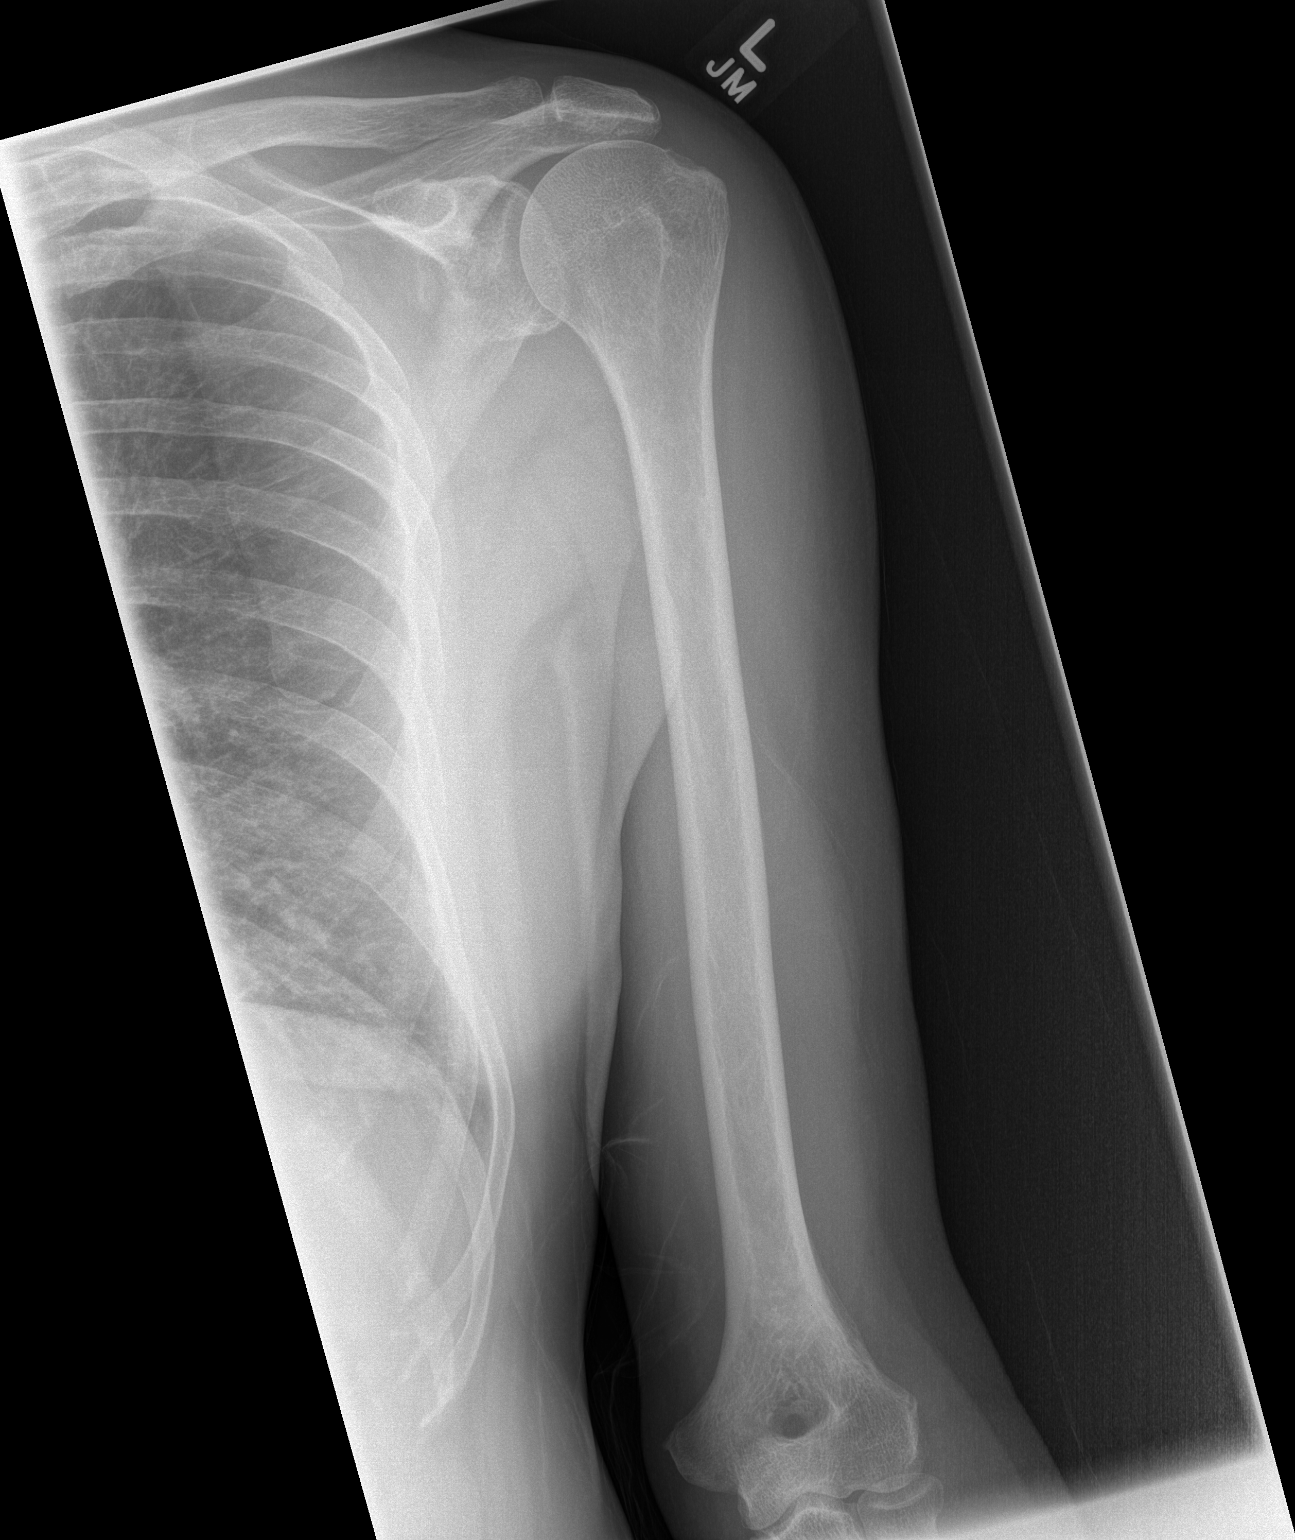

[w humerus lat left *]
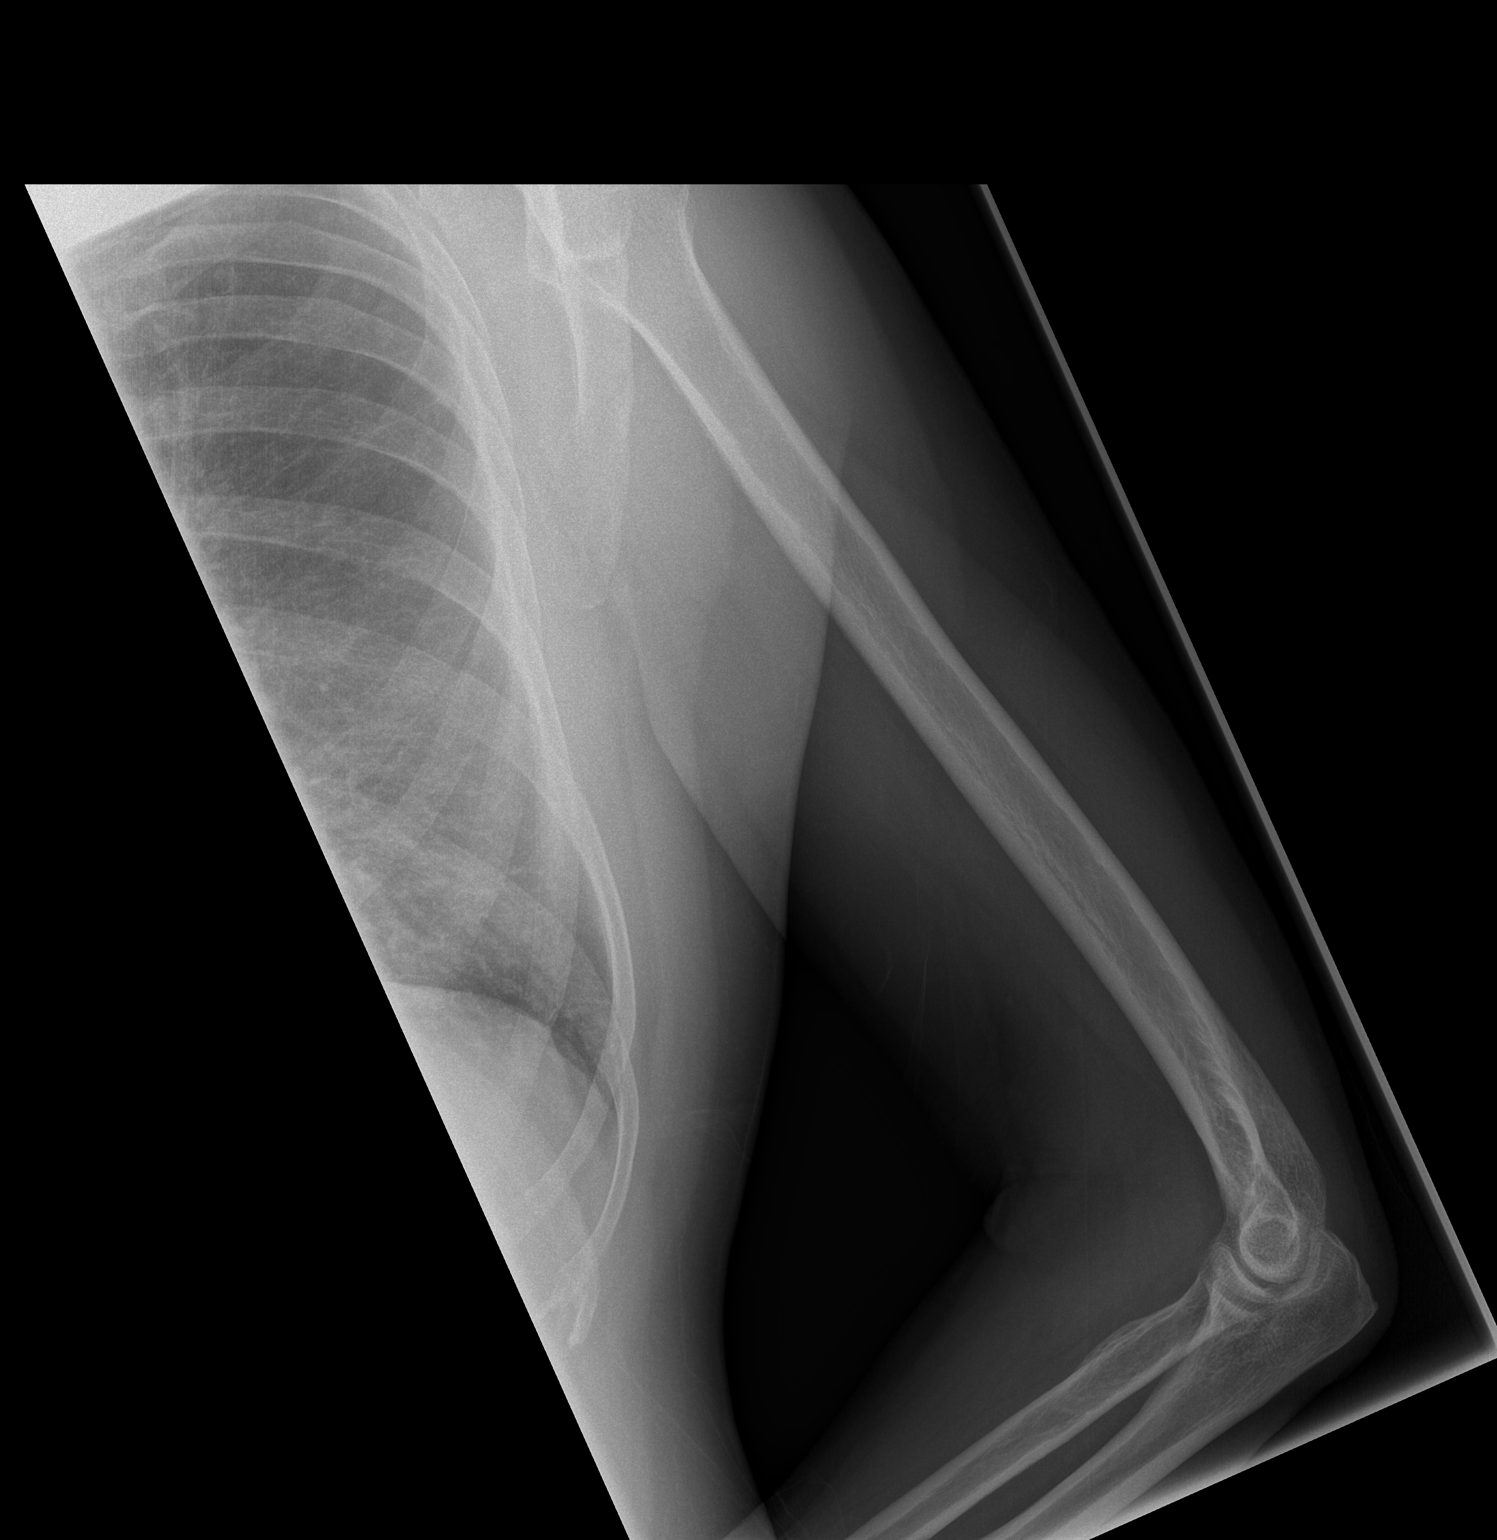

[2 of 2 positions shown; findings below may reference images not displayed]

FINDINGS: There is no evidence of fracture or other focal bone lesions. Soft
tissues are unremarkable. Visualized lung fields are clear. No
displaced rib fracture identified within the acquired field-of-view.
IMPRESSION: No acute osseous abnormality identified.

## 2023-05-20 IMAGING — CR DG HAND COMPLETE 3+V*L*
3 series · 3 of 3 positions shown · non-contrast
Comparison: None Available.

CLINICAL DATA: Pain after fall from ladder yesterday.

EXAM:
LEFT HAND - COMPLETE 3+ VIEW

[x hand pa left]
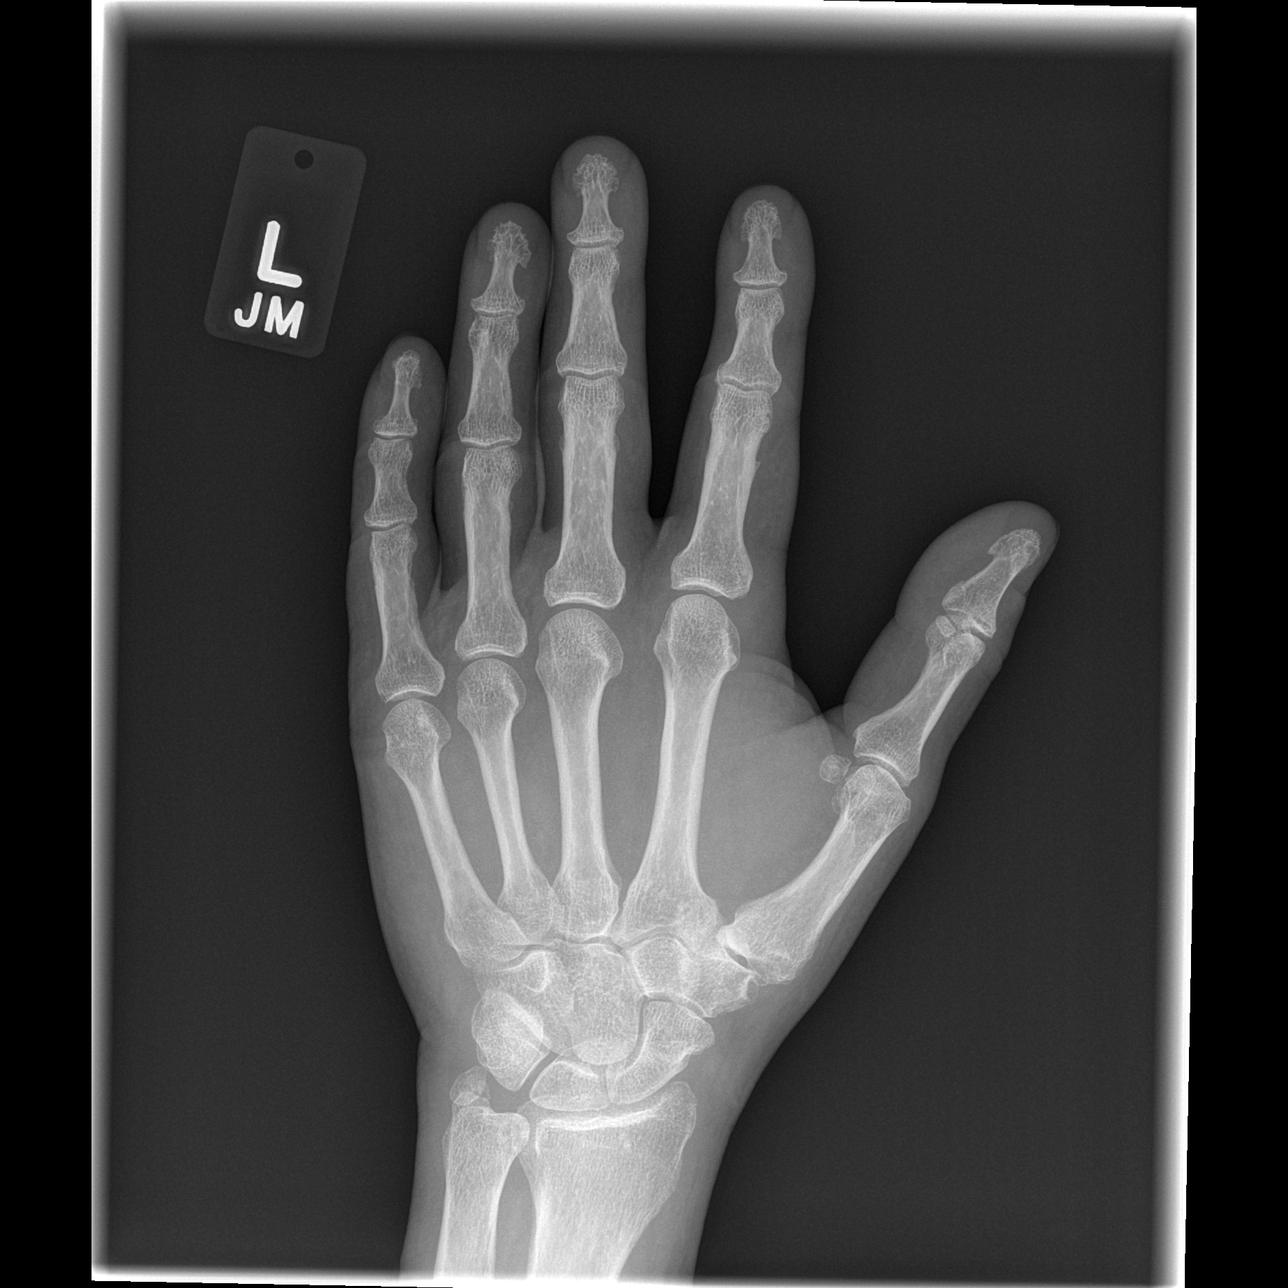

[x hand oblique left]
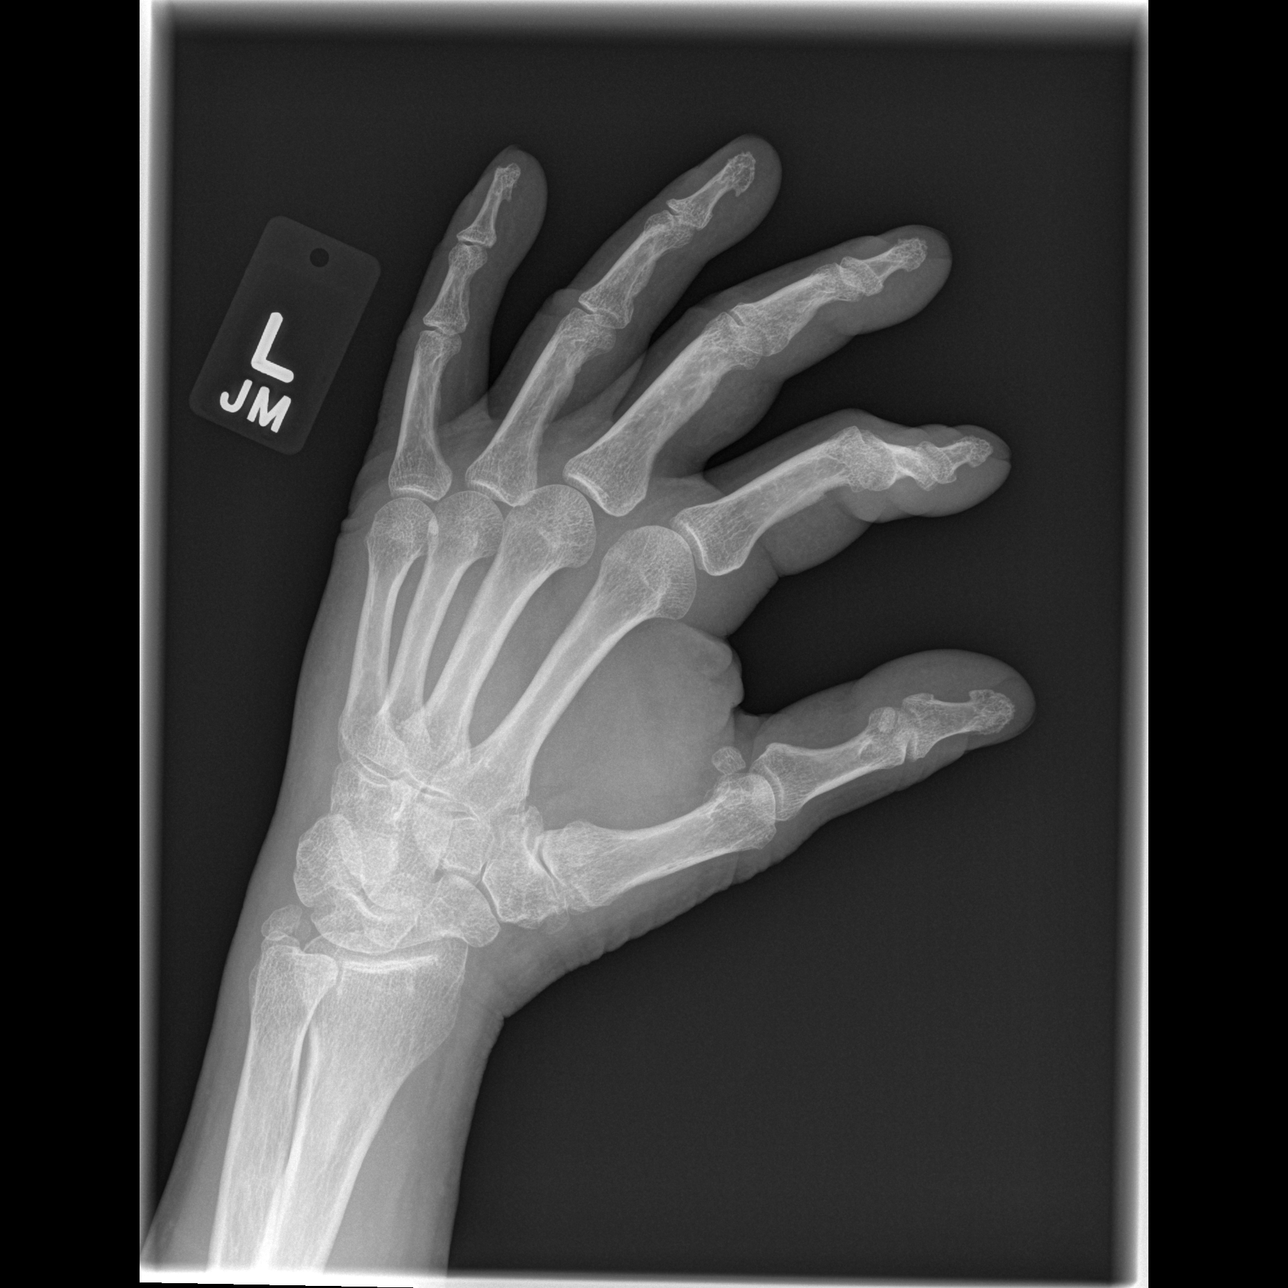

[x hand lat left]
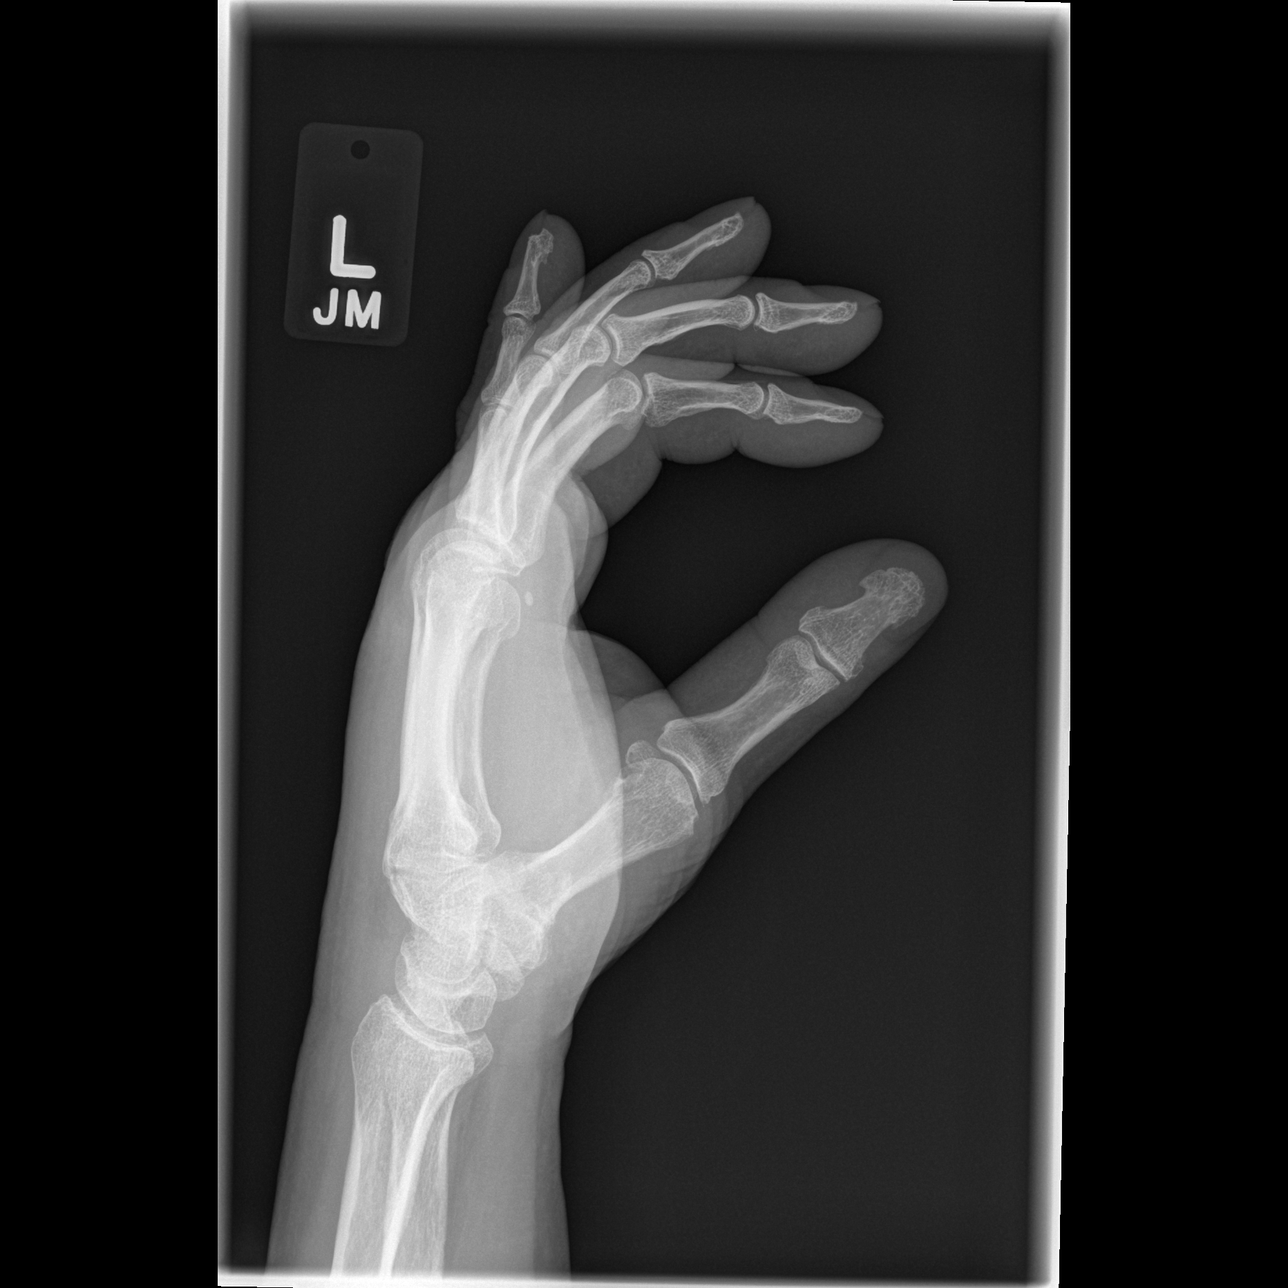

[3 of 3 positions shown; findings below may reference images not displayed]

FINDINGS: There is no evidence of acute fracture or dislocation. Well
corticated osseous fragment at the ulnar styloid process reflect
sequela prior trauma. Mild multifocal degenerative change of the
hand. Soft tissues are unremarkable.
IMPRESSION: 1. No acute fracture or dislocation of the left hand identified.
2. Mild multifocal degenerative change.
